# Patient Record
Sex: Male | Born: 1970
Health system: Southern US, Community
[De-identification: ages and names within clinical notes are randomized; demographics above are authoritative.]

## PROBLEM LIST (undated history)

## (undated) DIAGNOSIS — R748 Abnormal levels of other serum enzymes: Secondary | ICD-10-CM

## (undated) DIAGNOSIS — G8929 Other chronic pain: Secondary | ICD-10-CM

## (undated) DIAGNOSIS — J309 Allergic rhinitis, unspecified: Secondary | ICD-10-CM

## (undated) DIAGNOSIS — E559 Vitamin D deficiency, unspecified: Secondary | ICD-10-CM

## (undated) DIAGNOSIS — U071 COVID-19: Secondary | ICD-10-CM

## (undated) HISTORY — DX: COVID-19: U07.1

## (undated) HISTORY — DX: Abnormal levels of other serum enzymes: R74.8

## (undated) HISTORY — PX: WISDOM TOOTH EXTRACTION: SHX21

## (undated) HISTORY — DX: Other chronic pain: G89.29

## (undated) HISTORY — DX: Allergic rhinitis, unspecified: J30.9

## (undated) HISTORY — DX: Vitamin D deficiency, unspecified: E55.9

---
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
## 2018-05-05 ENCOUNTER — Other Ambulatory Visit: Payer: Self-pay

## 2018-05-05 ENCOUNTER — Ambulatory Visit
Admission: EM | Admit: 2018-05-05 | Discharge: 2018-05-05 | Disposition: A | Discharge status: Home / Self Care | Payer: BLUE CROSS/BLUE SHIELD | Attending: Family Medicine | Admitting: Family Medicine

## 2018-05-05 DIAGNOSIS — K921 Melena: Secondary | ICD-10-CM | POA: Insufficient documentation

## 2018-05-05 DIAGNOSIS — R1013 Epigastric pain: Secondary | ICD-10-CM | POA: Diagnosis not present

## 2018-05-05 DIAGNOSIS — R110 Nausea: Secondary | ICD-10-CM | POA: Diagnosis not present

## 2018-05-05 LAB — CBC WITH DIFFERENTIAL/PLATELET
ABS IMMATURE GRANULOCYTES: 0.02 10*3/uL (ref 0.00–0.07)
Basophils Absolute: 0 10*3/uL (ref 0.0–0.1)
Basophils Relative: 0 %
Eosinophils Absolute: 0 10*3/uL (ref 0.0–0.5)
Eosinophils Relative: 0 %
HCT: 41.7 % (ref 39.0–52.0)
Hemoglobin: 14.2 g/dL (ref 13.0–17.0)
Immature Granulocytes: 0 %
Lymphocytes Relative: 34 %
Lymphs Abs: 2.8 10*3/uL (ref 0.7–4.0)
MCH: 30.7 pg (ref 26.0–34.0)
MCHC: 34.1 g/dL (ref 30.0–36.0)
MCV: 90.3 fL (ref 80.0–100.0)
MONO ABS: 0.9 10*3/uL (ref 0.1–1.0)
Monocytes Relative: 11 %
Neutro Abs: 4.6 10*3/uL (ref 1.7–7.7)
Neutrophils Relative %: 55 %
Platelets: 280 10*3/uL (ref 150–400)
RBC: 4.62 MIL/uL (ref 4.22–5.81)
RDW: 12.4 % (ref 11.5–15.5)
WBC: 8.3 10*3/uL (ref 4.0–10.5)
nRBC: 0 % (ref 0.0–0.2)

## 2018-05-05 LAB — COMPREHENSIVE METABOLIC PANEL
ALT: 127 U/L — AB (ref 0–44)
AST: 74 U/L — ABNORMAL HIGH (ref 15–41)
Albumin: 3.7 g/dL (ref 3.5–5.0)
Alkaline Phosphatase: 50 U/L (ref 38–126)
Anion gap: 9 (ref 5–15)
BUN: 41 mg/dL — ABNORMAL HIGH (ref 6–20)
CO2: 25 mmol/L (ref 22–32)
Calcium: 8.4 mg/dL — ABNORMAL LOW (ref 8.9–10.3)
Chloride: 103 mmol/L (ref 98–111)
Creatinine, Ser: 0.84 mg/dL (ref 0.61–1.24)
GFR calc Af Amer: >60 mL/min (ref >60)
GFR calc non Af Amer: >60 mL/min (ref >60)
Glucose, Bld: 129 mg/dL — ABNORMAL HIGH (ref 70–99)
Potassium: 4.2 mmol/L (ref 3.5–5.1)
Sodium: 137 mmol/L (ref 135–145)
Total Bilirubin: 0.7 mg/dL (ref 0.3–1.2)
Total Protein: 6.9 g/dL (ref 6.5–8.1)

## 2018-05-05 MED ORDER — ONDANSETRON 8 MG PO TBDP: 8.0000 mg | ORAL_TABLET | Freq: Three times a day (TID) | ORAL | 0 refills | Status: DC | PRN

## 2018-05-05 MED ORDER — ONDANSETRON HCL 4 MG/2ML IJ SOLN: 8.0000 mg | Freq: Once | INTRAMUSCULAR | Status: DC

## 2018-05-05 MED ORDER — ONDANSETRON 8 MG PO TBDP
8.0000 mg | ORAL_TABLET | Freq: Once | ORAL | Status: AC
  Administered 2018-05-05: 8 mg via ORAL

## 2018-05-05 NOTE — ED Provider Notes (Signed)
[facility]    CSN: [id number] Arrival date & time: 05/05/18  1746     History   Chief Complaint Chief Complaint  Patient presents with  . Emesis    HPI Cory Blake is a 48 y.o. male.   48 yo male with a c/o nausea, lack of appetite, mild loose stools and since last night black tarry stools x 2. Denies any fevers, chills, abdominal pain, bright red blood per rectum, dizziness, chest pain, shortness of breath, hematemesis. States he had cold symptoms last week. Denies alcohol use.   The history is provided by the patient.    History reviewed. No pertinent past medical history.  There are no active problems to display for this patient.   Past Surgical History:  Procedure Laterality Date  . GASTRIC FUNDOPLICATION  4580  . WISDOM TOOTH EXTRACTION         Home Medications    Prior to Admission medications   Medication Sig Start Date End Date Taking? Authorizing Provider  ondansetron (ZOFRAN ODT) 8 MG disintegrating tablet Take 1 tablet (8 mg total) by mouth every 8 (eight) hours as needed. 05/05/18   Norval Gable, MD    Family History Family History  Problem Relation Age of Onset  . Kidney failure Father   . Heart disease Father     Social History Social History   Tobacco Use  . Smoking status: Never Smoker  . Smokeless tobacco: Never Used  Substance Use Topics  . Alcohol use: Yes    Comment: minimal  . Drug use: Not Currently     Allergies   Patient has no known allergies.   Review of Systems Review of Systems   Physical Exam Triage Vital Signs ED Triage Vitals  Enc Vitals Group     BP 05/05/18 1838 135/85     Pulse Rate 05/05/18 1838 (!) 113     Resp 05/05/18 1838 18     Temp 05/05/18 1838 97.8 F (36.6 C)     Temp Source 05/05/18 1838 Oral     SpO2 05/05/18 1838 96 %     Weight 05/05/18 1836 201 lb (91.2 kg)     Height 05/05/18 1836 6' (1.829 m)     Head Circumference --      Peak Flow --      Pain Score 05/05/18 1835 0       Pain Loc --      Pain Edu? --      Excl. in [place]? --    No data found.  Updated Vital Signs BP 102/78 (BP Location: Left Arm)   Pulse 99   Temp 97.8 F (36.6 C) (Oral)   Resp 18   Ht 6' (1.829 m)   Wt 91.2 kg   SpO2 98%   BMI 27.26 kg/m   Visual Acuity Right Eye Distance:   Left Eye Distance:   Bilateral Distance:    Right Eye Near:   Left Eye Near:    Bilateral Near:     Physical Exam Vitals signs and nursing note reviewed.  Constitutional:      General: He is not in acute distress.    Appearance: He is well-developed. He is not diaphoretic.  HENT:     Head: Normocephalic and atraumatic.  Cardiovascular:     Rate and Rhythm: Normal rate and regular rhythm.     Heart sounds: Normal heart sounds. No murmur.  Pulmonary:     Effort: Pulmonary effort is normal. No respiratory  distress.     Breath sounds: Normal breath sounds. No wheezing or rales.  Abdominal:     General: Bowel sounds are normal. There is no distension.     Palpations: Abdomen is soft. There is no mass.     Tenderness: There is no abdominal tenderness. There is no right CVA tenderness, left CVA tenderness, guarding or rebound.     Hernia: No hernia is present.  Skin:    Findings: No rash.  Neurological:     Mental Status: He is alert and oriented to person, place, and time.      UC Treatments / Results  Labs (all labs ordered are listed, but only abnormal results are displayed) Labs Reviewed  COMPREHENSIVE METABOLIC PANEL - Abnormal; Notable for the following components:      Result Value   Glucose, Bld 129 (*)    BUN 41 (*)    Calcium 8.4 (*)    AST 74 (*)    ALT 127 (*)    All other components within normal limits  CBC WITH DIFFERENTIAL/PLATELET    EKG None  Radiology No results found.  Procedures Procedures (including critical care time)  Medications Ordered in UC Medications  ondansetron (ZOFRAN-ODT) disintegrating tablet 8 mg (8 mg Oral Given 05/05/18 2034)     Initial Impression / Assessment and Plan / UC Course  I have reviewed the triage vital signs and the nursing notes.  Pertinent labs & imaging results that were available during my care of the patient were reviewed by me and considered in my medical decision making (see chart for details).      Final Clinical Impressions(s) / UC Diagnoses   Final diagnoses:  Melena  Nausea without vomiting  Dyspepsia     Discharge Instructions     Prilosec or Nexium over the counter once a day Follow up with gastroenterologist    ED Prescriptions    Medication Sig Dispense Auth. Provider   ondansetron (ZOFRAN ODT) 8 MG disintegrating tablet Take 1 tablet (8 mg total) by mouth every 8 (eight) hours as needed. 6 tablet Norval Gable, MD      1. Lab results and diagnosis reviewed with patient 2. rx as per orders above; reviewed possible side effects, interactions, risks and benefits  3. Recommend supportive treatment as above 4. Follow up with GI  Controlled Substance Prescriptions [place] Controlled Substance Registry consulted? Not Applicable   Norval Gable, MD 05/05/18 2222

## 2018-05-05 NOTE — Discharge Instructions (Addendum)
Prilosec or Nexium over the counter once a day Follow up with gastroenterologist

## 2018-05-05 NOTE — ED Triage Notes (Signed)
Patient complains of nausea, lack of appetite and diarrhea. Patient states that he has noticed a black tarry" frothy" stool. Patient states that he has been unable to eat. States that he has cold like symptoms last week.

## 2018-05-13 ENCOUNTER — Ambulatory Visit (INDEPENDENT_AMBULATORY_CARE_PROVIDER_SITE_OTHER): Payer: BLUE CROSS/BLUE SHIELD | Admitting: Gastroenterology

## 2018-05-13 ENCOUNTER — Encounter: Payer: Self-pay | Admitting: Gastroenterology

## 2018-05-13 DIAGNOSIS — K921 Melena: Secondary | ICD-10-CM

## 2018-05-13 DIAGNOSIS — R1013 Epigastric pain: Secondary | ICD-10-CM | POA: Diagnosis not present

## 2018-05-13 DIAGNOSIS — R748 Abnormal levels of other serum enzymes: Secondary | ICD-10-CM | POA: Diagnosis not present

## 2018-05-13 DIAGNOSIS — R110 Nausea: Secondary | ICD-10-CM

## 2018-05-13 NOTE — Progress Notes (Signed)
Cory Blake  [place], [place] [address]  Main: [phone]  Fax: [phone]   Gastroenterology Consultation  Referring Provider:     Dr. Norval Gable Primary Care Physician:  Patient, No Pcp Per Reason for Consultation:     Melena        HPI:    Chief Complaint  Patient presents with  . Establish Care    Melena, nausea w/o vomiting, dyspepsia    Cory Blake is a 48 y.o. y/o male referred for consultation & management  by Dr. Patient, No Pcp Per.  Patient went to the ER on May 05, 2018 after 1 to 2-day history of black stool.  Patient reports using Advil for 2 to 3 days prior to that episode.  No nausea vomiting, hematochezia, or hematemesis.  No abdominal pain.  No Pepto-Bismol or oral iron use prior to this episode.  No prior history of melena.  In the ER CBC was normal and patient was discharged with outpatient follow-up.  Prior to this episode he describes having loose stools at home as well.  AST ALT were mildly elevated during the ER visit.  Patient was discharged with Prilosec per ER provider.  Patient also describes 1-2 episodes of dysphagia couple but never food impaction.  No prior upper endoscopy or colonoscopy.  No family history of colon cancer.  Past medical history: None  Past Surgical History:  Procedure Laterality Date  . GASTRIC FUNDOPLICATION  4742  . WISDOM TOOTH EXTRACTION      Prior to Admission medications   Medication Sig Start Date End Date Taking? Authorizing Provider  omeprazole (PRILOSEC) 20 MG capsule Take 20 mg by mouth daily.   Yes [provider]  ondansetron (ZOFRAN ODT) 8 MG disintegrating tablet Take 1 tablet (8 mg total) by mouth every 8 (eight) hours as needed. Patient not taking: Reported on 05/13/2018 05/05/18   Norval Gable, MD    Family History  Problem Relation Age of Onset  . Kidney failure Father   . Heart disease Father      Social History   Tobacco Use  . Smoking status:  Never Smoker  . Smokeless tobacco: Never Used  Substance Use Topics  . Alcohol use: Yes    Comment: minimal  . Drug use: Not Currently    Allergies as of 05/13/2018  . (No Known Allergies)    Review of Systems:    All systems reviewed and negative except where noted in HPI.   Physical Exam:   Psych:  Alert and cooperative. Normal mood and affect. General:   Alert,  Well-developed, well-nourished, pleasant and cooperative in NAD Head:  Normocephalic and atraumatic. Eyes:  Sclera clear, no icterus.   Conjunctiva pink. Ears:  Normal auditory acuity. Nose:  No deformity, discharge, or lesions. Mouth:  No deformity or lesions,oropharynx pink & moist. Neck:  Supple; no masses or thyromegaly. Abdomen:  Normal bowel sounds.  No bruits.  Soft, non-tender and non-distended without masses, hepatosplenomegaly or hernias noted.  No guarding or rebound tenderness.    Msk:  Symmetrical without gross deformities. Good, equal movement & strength bilaterally. Pulses:  Normal pulses noted. Extremities:  No clubbing or edema.  No cyanosis. Neurologic:  Alert and oriented x3;  grossly normal neurologically. Skin:  Intact without significant lesions or rashes. No jaundice. Lymph Nodes:  No significant cervical adenopathy. Psych:  Alert and cooperative. Normal mood and affect.   Labs: CBC    Component Value Date/Time  WBC 8.3 05/05/2018 2023   RBC 4.62 05/05/2018 2023   HGB 14.2 05/05/2018 2023   HCT 41.7 05/05/2018 2023   PLT 280 05/05/2018 2023   MCV 90.3 05/05/2018 2023   MCH 30.7 05/05/2018 2023   MCHC 34.1 05/05/2018 2023   RDW 12.4 05/05/2018 2023   LYMPHSABS 2.8 05/05/2018 2023   MONOABS 0.9 05/05/2018 2023   EOSABS 0.0 05/05/2018 2023   BASOSABS 0.0 05/05/2018 2023   CMP     Component Value Date/Time   NA 137 05/05/2018 2023   K 4.2 05/05/2018 2023   CL 103 05/05/2018 2023   CO2 25 05/05/2018 2023   GLUCOSE 129 (H) 05/05/2018 2023   BUN 41 (H) 05/05/2018 2023    CREATININE 0.84 05/05/2018 2023   CALCIUM 8.4 (L) 05/05/2018 2023   PROT 6.9 05/05/2018 2023   ALBUMIN 3.7 05/05/2018 2023   AST 74 (H) 05/05/2018 2023   ALT 127 (H) 05/05/2018 2023   ALKPHOS 50 05/05/2018 2023   BILITOT 0.7 05/05/2018 2023   GFRNONAA >60 05/05/2018 2023   GFRAA >60 05/05/2018 2023    Imaging Studies: No results found.  Assessment and Plan:   Cory Blake is a 48 y.o. y/o male has been referred for episode of melanotic stool that led to an ER visit in January 2020, with subsequent resolution of symptoms, normal hemoglobin during the ER visit  Since patient's episode of melena occurred after 3-day history of Aleve use, peptic ulcer disease is certainly a possibility However, his hemoglobin was stable and now his bowel movements consist of formed brown stool with no blood, or diarrhea.  Therefore, no signs of active GI bleeding at this time  Patient is interested in upper endoscopy to rule out any underlying lesions, which is reasonable  Patient advised to avoid NSAIDs  We also discussed that he is due for screening colonoscopy current American Cancer Society guidelines.  Patient is interested in doing both EGD and colonoscopy together, however he is going away for vacation for at least 3 weeks next week and since elective colonoscopy cannot be scheduled before then due to schedule availability, he is interested in going ahead with EGD this week, and screening colonoscopy in the future.  Therefore we will schedule for EGD at this time  We will also repeat liver enzymes and initiate hepatitis work-up along with right upper quadrant ultrasound for elevated liver enzymes which were likely transient  Patient is in process of setting a primary care provider and the importance of doing this was discussed as well and he verbalized understanding.  I have discussed alternative options, risks & benefits,  which include, but are not limited to, bleeding, infection,  perforation,respiratory complication & drug reaction.  The patient agrees with this plan & written consent will be obtained.       Dr Cory Antigua  Speech recognition software was used to dictate the above note.

## 2018-05-14 LAB — CERULOPLASMIN: Ceruloplasmin: 28 mg/dL (ref 16.0–31.0)

## 2018-05-14 LAB — HEPATIC FUNCTION PANEL
ALT: 61 IU/L — ABNORMAL HIGH (ref 0–44)
AST: 27 IU/L (ref 0–40)
Albumin: 4.1 g/dL (ref 4.0–5.0)
Alkaline Phosphatase: 65 IU/L (ref 39–117)
Bilirubin Total: 0.5 mg/dL (ref 0.0–1.2)
Bilirubin, Direct: 0.15 mg/dL (ref 0.00–0.40)
Total Protein: 6.3 g/dL (ref 6.0–8.5)

## 2018-05-14 LAB — HCV COMMENT:

## 2018-05-14 LAB — HEPATITIS A ANTIBODY, TOTAL: Hep A Total Ab: NEGATIVE

## 2018-05-14 LAB — HEPATITIS B CORE ANTIBODY, TOTAL: Hep B Core Total Ab: NEGATIVE

## 2018-05-14 LAB — HEPATITIS B SURFACE ANTIBODY,QUALITATIVE: Hep B Surface Ab, Qual: REACTIVE

## 2018-05-14 LAB — FERRITIN: Ferritin: 86 ng/mL (ref 30–400)

## 2018-05-14 LAB — HEPATITIS A ANTIBODY, IGM: Hep A IgM: NEGATIVE

## 2018-05-14 LAB — MITOCHONDRIAL/SMOOTH MUSCLE AB PNL
Mitochondrial Ab: <20 Units (ref 0.0–20.0)
Smooth Muscle Ab: 13 Units (ref 0–19)

## 2018-05-14 LAB — HEPATITIS B SURFACE ANTIGEN: Hepatitis B Surface Ag: NEGATIVE

## 2018-05-14 LAB — HEPATITIS C ANTIBODY (REFLEX)

## 2018-05-15 ENCOUNTER — Ambulatory Visit: Payer: BLUE CROSS/BLUE SHIELD | Admitting: Certified Registered"

## 2018-05-15 ENCOUNTER — Other Ambulatory Visit: Payer: Self-pay

## 2018-05-15 ENCOUNTER — Ambulatory Visit
Admission: RE | Admit: 2018-05-15 | Discharge: 2018-05-15 | Disposition: A | Discharge status: Home / Self Care | Payer: BLUE CROSS/BLUE SHIELD | Attending: Gastroenterology | Admitting: Gastroenterology

## 2018-05-15 ENCOUNTER — Encounter
Admission: RE | Disposition: A | Discharge status: Home / Self Care | Payer: Self-pay | Source: Home / Self Care | Attending: Gastroenterology

## 2018-05-15 ENCOUNTER — Telehealth: Payer: Self-pay

## 2018-05-15 ENCOUNTER — Encounter: Payer: Self-pay | Admitting: *Deleted

## 2018-05-15 DIAGNOSIS — Z8249 Family history of ischemic heart disease and other diseases of the circulatory system: Secondary | ICD-10-CM | POA: Insufficient documentation

## 2018-05-15 DIAGNOSIS — Z9889 Other specified postprocedural states: Secondary | ICD-10-CM

## 2018-05-15 DIAGNOSIS — Z841 Family history of disorders of kidney and ureter: Secondary | ICD-10-CM | POA: Insufficient documentation

## 2018-05-15 DIAGNOSIS — R945 Abnormal results of liver function studies: Secondary | ICD-10-CM | POA: Diagnosis not present

## 2018-05-15 DIAGNOSIS — Z79899 Other long term (current) drug therapy: Secondary | ICD-10-CM | POA: Insufficient documentation

## 2018-05-15 DIAGNOSIS — K921 Melena: Secondary | ICD-10-CM

## 2018-05-15 DIAGNOSIS — R748 Abnormal levels of other serum enzymes: Secondary | ICD-10-CM

## 2018-05-15 HISTORY — PX: ESOPHAGOGASTRODUODENOSCOPY (EGD) WITH PROPOFOL: SHX5813

## 2018-05-15 SURGERY — ESOPHAGOGASTRODUODENOSCOPY (EGD) WITH PROPOFOL
Anesthesia: General

## 2018-05-15 MED ORDER — ALBUTEROL SULFATE HFA 108 (90 BASE) MCG/ACT IN AERS
INHALATION_SPRAY | RESPIRATORY_TRACT | Status: DC | PRN
  Administered 2018-05-15: 2 via RESPIRATORY_TRACT

## 2018-05-15 MED ORDER — PROPOFOL 500 MG/50ML IV EMUL
INTRAVENOUS | Status: DC | PRN
  Administered 2018-05-15: 200 ug/kg/min via INTRAVENOUS

## 2018-05-15 MED ORDER — SODIUM CHLORIDE 0.9 % IV SOLN
INTRAVENOUS | Status: DC
  Administered 2018-05-15: 1000 mL via INTRAVENOUS

## 2018-05-15 MED ORDER — PROPOFOL 10 MG/ML IV BOLUS
INTRAVENOUS | Status: AC
  Filled 2018-05-15: qty 20

## 2018-05-15 MED ORDER — PROPOFOL 10 MG/ML IV BOLUS
INTRAVENOUS | Status: AC
  Filled 2018-05-15: qty 40

## 2018-05-15 MED ORDER — PROPOFOL 10 MG/ML IV BOLUS
INTRAVENOUS | Status: DC | PRN
  Administered 2018-05-15: 40 mg via INTRAVENOUS
  Administered 2018-05-15: 60 mg via INTRAVENOUS

## 2018-05-15 NOTE — Anesthesia Preprocedure Evaluation (Signed)
Anesthesia Evaluation  Patient identified by MRN, date of birth, ID band Patient awake    Reviewed: Allergy & Precautions, H&P , NPO status , Patient's Chart, lab work & pertinent test results, reviewed documented beta blocker date and time   Airway Mallampati: II   Neck ROM: full    Dental  (+) Poor Dentition   Pulmonary neg shortness of breath, asthma ,    Pulmonary exam normal        Cardiovascular Exercise Tolerance: Good negative cardio ROS Normal cardiovascular exam Rhythm:regular Rate:Normal     Neuro/Psych negative neurological ROS  negative psych ROS   GI/Hepatic negative GI ROS, Neg liver ROS,   Endo/Other  negative endocrine ROS  Renal/GU negative Renal ROS  negative genitourinary   Musculoskeletal   Abdominal   Peds  Hematology negative hematology ROS (+)   Anesthesia Other Findings History reviewed. No pertinent past medical history. Past Surgical History: 9480: GASTRIC FUNDOPLICATION No date: WISDOM TOOTH EXTRACTION   Reproductive/Obstetrics negative OB ROS                             Anesthesia Physical Anesthesia Plan  ASA: II  Anesthesia Plan: General   Post-op Pain Management:    Induction:   PONV Risk Score and Plan:   Airway Management Planned:   Additional Equipment:   Intra-op Plan:   Post-operative Plan:   Informed Consent: I have reviewed the patients History and Physical, chart, labs and discussed the procedure including the risks, benefits and alternatives for the proposed anesthesia with the patient or authorized representative who has indicated his/her understanding and acceptance.     Dental Advisory Given  Plan Discussed with: CRNA  Anesthesia Plan Comments:         Anesthesia Quick Evaluation

## 2018-05-15 NOTE — H&P (Signed)
Vonda Antigua, MD [address], [place], [place], [place], [address] [address], [place], [place], [place], [address] Phone: [phone]  Fax: [phone]  Primary Care Physician:  Patient, No Pcp Per   Pre-Procedure History & Physical: HPI:  Cory Blake is a 48 y.o. male is here for an EGD.   History reviewed. No pertinent past medical history.  Past Surgical History:  Procedure Laterality Date  . GASTRIC FUNDOPLICATION  7209  . WISDOM TOOTH EXTRACTION      Prior to Admission medications   Medication Sig Start Date End Date Taking? Authorizing Provider  omeprazole (PRILOSEC) 20 MG capsule Take 20 mg by mouth daily.   Yes [provider]  ondansetron (ZOFRAN ODT) 8 MG disintegrating tablet Take 1 tablet (8 mg total) by mouth every 8 (eight) hours as needed. Patient not taking: Reported on 05/13/2018 05/05/18   Norval Gable, MD    Allergies as of 05/13/2018  . (No Known Allergies)    Family History  Problem Relation Age of Onset  . Kidney failure Father   . Heart disease Father     Social History   Socioeconomic History  . Marital status: Married    Spouse name: Not on file  . Number of children: Not on file  . Years of education: Not on file  . Highest education level: Not on file  Occupational History  . Not on file  Social Needs  . Financial resource strain: Not on file  . Food insecurity:    Worry: Not on file    Inability: Not on file  . Transportation needs:    Medical: Not on file    Non-medical: Not on file  Tobacco Use  . Smoking status: Never Smoker  . Smokeless tobacco: Never Used  Substance and Sexual Activity  . Alcohol use: Yes    Comment: minimal  . Drug use: Never  . Sexual activity: Not on file  Lifestyle  . Physical activity:    Days per week: Not on file    Minutes per session: Not on file  . Stress: Not on file  Relationships  . Social connections:    Talks on phone: Not on file    Gets together: Not on file      Attends religious service: Not on file    Active member of club or organization: Not on file    Attends meetings of clubs or organizations: Not on file    Relationship status: Not on file  . Intimate partner violence:    Fear of current or ex partner: Not on file    Emotionally abused: Not on file    Physically abused: Not on file    Forced sexual activity: Not on file  Other Topics Concern  . Not on file  Social History Narrative  . Not on file    Review of Systems: See HPI, otherwise negative ROS  Physical Exam: BP (!) 121/94   Pulse 81   Temp (!) 97.3 F (36.3 C) (Tympanic)   Resp 18   Ht 5' 11.5" (1.816 m)   Wt 87.5 kg   SpO2 100%   BMI 26.54 kg/m  General:   Alert,  pleasant and cooperative in NAD Head:  Normocephalic and atraumatic. Neck:  Supple; no masses or thyromegaly. Lungs:  Clear throughout to auscultation, normal respiratory effort.    Heart:  +S1, +S2, Regular rate and rhythm, No edema. Abdomen:  Soft, nontender and nondistended. Normal bowel sounds, without guarding, and  without rebound.   Neurologic:  Alert and  oriented x4;  grossly normal neurologically.  Impression/Plan: Cory Blake is here for an EGD for melena.  Risks, benefits, limitations, and alternatives regarding the procedure have been reviewed with the patient.  Questions have been answered.  All parties agreeable.   [name], MD  05/15/2018, 9:42 AM

## 2018-05-15 NOTE — Anesthesia Post-op Follow-up Note (Signed)
Anesthesia QCDR form completed.        

## 2018-05-15 NOTE — Telephone Encounter (Signed)
RUQ ultrasound scheduled for 06/10/2018 at the [address] [facility] imaging (pt given address) at 8 am, arrival time 7:45 am. Nothing to eat or drink after midnight the day before ultrasound. Pt voiced understanding.

## 2018-05-15 NOTE — Transfer of Care (Signed)
Immediate Anesthesia Transfer of Care Note  Patient: Cory Blake  Procedure(s) Performed: ESOPHAGOGASTRODUODENOSCOPY (EGD) WITH PROPOFOL (N/A )  Patient Location: PACU  Anesthesia Type:General  Level of Consciousness: drowsy  Airway & Oxygen Therapy: Patient Spontanous Breathing and Patient connected to face mask oxygen  Post-op Assessment: Report given to RN  Post vital signs: Reviewed and stable  Last Vitals:  Vitals Value Taken Time  BP 96/67 05/15/2018 10:02 AM  Temp 36.1 C 05/15/2018 10:01 AM  Pulse 92 05/15/2018 10:02 AM  Resp 12 05/15/2018 10:02 AM  SpO2 100 % 05/15/2018 10:02 AM  Vitals shown include unvalidated device data.  Last Pain:  Vitals:   05/15/18 1001  TempSrc: Tympanic  PainSc: Asleep         Complications: No apparent anesthesia complications

## 2018-05-15 NOTE — Op Note (Signed)
[facility] Patient Name: Cory Blake Procedure Date: 05/15/2018 9:24 AM MRN: [id number] Account #: [id number] Date of Birth: 09/24/1970 Admit Type: Outpatient Age: 48 Room: [facility] ENDO ROOM 2 Gender: Male Note Status: Finalized Procedure:            Upper GI endoscopy Indications:          Melena Providers:            Daryus Sowash B. Bonna Gains MD, MD Referring MD:         Forest Gleason Md, MD (Referring MD) Medicines:            Monitored Anesthesia Care Complications:        No immediate complications. Procedure:            Pre-Anesthesia Assessment:                       - Prior to the procedure, a History and Physical was                        performed, and patient medications, allergies and                        sensitivities were reviewed. The patient's tolerance of                        previous anesthesia was reviewed.                       - The risks and benefits of the procedure and the                        sedation options and risks were discussed with the                        patient. All questions were answered and informed                        consent was obtained.                       - Patient identification and proposed procedure were                        verified prior to the procedure by the physician, the                        nurse, the anesthesiologist, the anesthetist and the                        technician. The procedure was verified in the procedure                        room.                       - ASA Grade Assessment: II - A patient with mild                        systemic disease.                       After  obtaining informed consent, the endoscope was                        passed under direct vision. Throughout the procedure,                        the patient's blood pressure, pulse, and oxygen                        saturations were monitored continuously. The Endoscope                        was introduced  through the mouth, and advanced to the                        second part of duodenum. The upper GI endoscopy was                        accomplished with ease. The patient tolerated the                        procedure well. Findings:      The examined esophagus was normal.      Evidence of a Nissen fundoplication was found in the gastric fundus. The       wrap appeared intact. This was traversed.      The exam of the stomach was otherwise normal.      The duodenal bulb, second portion of the duodenum and examined duodenum       were normal. Impression:           - Normal esophagus.                       - A Nissen fundoplication was found. The wrap appears                        intact.                       - Normal duodenal bulb, second portion of the duodenum                        and examined duodenum.                       - No specimens collected. Recommendation:       - Perform a colonoscopy, for screening. Please call pt                        to schedule.                       - Discharge patient to home (with escort).                       - Advance diet as tolerated.                       - Continue present medications.                       - Patient has a contact number available for  emergencies. The signs and symptoms of potential                        delayed complications were discussed with the patient.                        Return to normal activities tomorrow. Written discharge                        instructions were provided to the patient.                       - Discharge patient to home (with escort).                       - The findings and recommendations were discussed with                        the patient.                       - The findings and recommendations were discussed with                        the patient's family. Procedure Code(s):    --- Professional ---                       [phone], Esophagogastroduodenoscopy,  flexible, transoral;                        diagnostic, including collection of specimen(s) by                        brushing or washing, when performed (separate procedure) Diagnosis Code(s):    --- Professional ---                       M22.633, Other specified postprocedural states                       K92.1, Melena (includes Hematochezia) CPT copyright 2018 American Medical Association. All rights reserved. The codes documented in this report are preliminary and upon coder review may  be revised to meet current compliance requirements.  Vonda Antigua, MD Margretta Sidle B. Bonna Gains MD, MD 05/15/2018 10:05:29 AM This report has been signed electronically. Number of Addenda: 0 Note Initiated On: 05/15/2018 9:24 AM Estimated Blood Loss: Estimated blood loss: none.      [facility]

## 2018-05-16 ENCOUNTER — Encounter: Payer: Self-pay | Admitting: Gastroenterology

## 2018-05-16 NOTE — Anesthesia Postprocedure Evaluation (Signed)
Anesthesia Post Note  Patient: Cory Blake  Procedure(s) Performed: ESOPHAGOGASTRODUODENOSCOPY (EGD) WITH PROPOFOL (N/A )  Patient location during evaluation: PACU Anesthesia Type: General Level of consciousness: awake and alert Pain management: pain level controlled Vital Signs Assessment: post-procedure vital signs reviewed and stable Respiratory status: spontaneous breathing, nonlabored ventilation, respiratory function stable and patient connected to nasal cannula oxygen Cardiovascular status: blood pressure returned to baseline and stable Postop Assessment: no apparent nausea or vomiting Anesthetic complications: no     Last Vitals:  Vitals:   05/15/18 1001 05/15/18 1031  BP: 96/67 103/71  Pulse:    Resp:    Temp: (!) 36.1 C   SpO2:      Last Pain:  Vitals:   05/16/18 0748  TempSrc:   PainSc: 0-No pain                 Molli Barrows

## 2018-06-10 ENCOUNTER — Other Ambulatory Visit: Payer: Self-pay

## 2018-06-10 ENCOUNTER — Ambulatory Visit
Admission: RE | Admit: 2018-06-10 | Discharge: 2018-06-10 | Disposition: A | Discharge status: Home / Self Care | Payer: BLUE CROSS/BLUE SHIELD | Source: Ambulatory Visit | Attending: Gastroenterology | Admitting: Gastroenterology

## 2018-06-10 DIAGNOSIS — R748 Abnormal levels of other serum enzymes: Secondary | ICD-10-CM | POA: Insufficient documentation

## 2018-06-10 DIAGNOSIS — R7989 Other specified abnormal findings of blood chemistry: Secondary | ICD-10-CM | POA: Diagnosis not present

## 2018-06-17 ENCOUNTER — Telehealth: Payer: Self-pay

## 2018-06-17 NOTE — Telephone Encounter (Signed)
-----   Message from [name], MD sent at 06/11/2018 10:41 AM EST ----- Cory Blake please let patient know, his ultrasound was normal. His liver is reported to be normal. We will repeat his liver enzymes on his visit to evaluate if they have improved. He should set up a PCP before his next visit. He should avoid OTC supplements, excessive alcohol use.

## 2018-06-17 NOTE — Telephone Encounter (Signed)
Attempted to contact patient however his voicemail has not been set up.  I've emailed him to contact the office to discuss results.  Thanks [employer]

## 2018-06-18 ENCOUNTER — Telehealth: Payer: Self-pay

## 2018-06-18 NOTE — Telephone Encounter (Signed)
Pt notified of ultrasound of liver results and of need of lab work on next visit (5/14). Also informed of need to have PCP by his next visit and to avoid OTC supplements, excessive alcohol use.

## 2018-06-18 NOTE — Telephone Encounter (Signed)
-----   Message from [name], MD sent at 06/11/2018 10:41 AM EST ----- Jackelyn Poling please let patient know, his ultrasound was normal. His liver is reported to be normal. We will repeat his liver enzymes on his visit to evaluate if they have improved. He should set up a PCP before his next visit. He should avoid OTC supplements, excessive alcohol use.

## 2018-06-27 ENCOUNTER — Other Ambulatory Visit: Payer: Self-pay

## 2018-07-10 ENCOUNTER — Telehealth: Payer: Self-pay

## 2018-07-10 NOTE — Telephone Encounter (Signed)
Copied from [place] [id number]. Topic: Appointment Scheduling - New Patient >> Jul 10, 2018  3:27 PM Leward Quan A wrote: New patient has been scheduled for your office. Provider: Karlyn Agee -Scocuzza Date of Appointment: 09/17/2018  Route to department's PEC pool.

## 2018-08-11 ENCOUNTER — Other Ambulatory Visit: Payer: Self-pay

## 2018-08-11 ENCOUNTER — Ambulatory Visit (INDEPENDENT_AMBULATORY_CARE_PROVIDER_SITE_OTHER): Payer: BLUE CROSS/BLUE SHIELD | Admitting: Gastroenterology

## 2018-08-11 ENCOUNTER — Ambulatory Visit: Payer: BLUE CROSS/BLUE SHIELD | Admitting: Gastroenterology

## 2018-08-11 ENCOUNTER — Encounter: Payer: Self-pay | Admitting: Gastroenterology

## 2018-08-11 DIAGNOSIS — R748 Abnormal levels of other serum enzymes: Secondary | ICD-10-CM

## 2018-08-11 NOTE — Addendum Note (Signed)
Addended by: Earl Lagos on: 08/11/2018 11:41 AM   Modules accepted: Orders

## 2018-08-11 NOTE — Progress Notes (Addendum)
Cory Antigua, MD [address]  [place]  [place], [place] [address]  Main: [phone]  Fax: [phone]   Primary Care Physician: Patient, No Pcp Per  Virtual Visit via Video Note  I connected with patient on 08/11/18 at  9:15 AM EDT by video (using [email or web address]) and verified that I am speaking with the correct person using two identifiers.   I discussed the limitations, risks, security and privacy concerns of performing an evaluation and management service by video and the availability of in person appointments. I also discussed with the patient that there may be a patient responsible charge related to this service. The patient expressed understanding and agreed to proceed.  Location of Patient: Home Location of Provider: Home Persons involved: Patient and provider only (Nursing staff checked in patient via phone but were not physically involved in the video interaction - see their notes)   History of Present Illness: Chief Complaint  Patient presents with  . Follow-up    elevated liver enzymes    HPI: Cory Blake is a 48 y.o. male previously seen in February 2020.  At that time patient had reported 3-day history of melena that occurred in January 2020 and then self resolved.  Patient did not have any other symptoms besides that.  He underwent EGD in February 2020 which did not reveal any ulcers or source of melena.  Symptoms have since resolved patient does not have any black stool.  The patient denies abdominal or flank pain, anorexia, nausea or vomiting, dysphagia, change in bowel habits or black or bloody stools or weight loss.  Patient has never had a screening colonoscopy.  His colonoscopy was discussed during his prior appointment to rule out any colonic lesions that could have also led to the melena.  At that time patient was going out of town and wanted to get the EGD done quickly and therefore refused scheduling colonoscopy with the EGD at that time.  He was  also noted to have elevated liver enzymes in January 2020, and therefore had ordered blood work for elevated liver enzymes.  This showed improvement in liver enzymes with only mildly elevated ALT to his 60s in February 2020.  The rest of his liver work-up including viral and autoimmune hepatitis work-up and liver ultrasound was normal.  Patient denies any heavy alcohol intake or hepatotoxic drugs.  Current Outpatient Medications  Medication Sig Dispense Refill  . omeprazole (PRILOSEC) 20 MG capsule Take 20 mg by mouth daily.    . ondansetron (ZOFRAN ODT) 8 MG disintegrating tablet Take 1 tablet (8 mg total) by mouth every 8 (eight) hours as needed. (Patient not taking: Reported on 05/13/2018) 6 tablet 0   No current facility-administered medications for this visit.     Allergies as of 08/11/2018  . (No Known Allergies)    Review of Systems:    All systems reviewed and negative except where noted in HPI.   Observations/Objective:  Labs: CMP     Component Value Date/Time   NA 137 05/05/2018 2023   K 4.2 05/05/2018 2023   CL 103 05/05/2018 2023   CO2 25 05/05/2018 2023   GLUCOSE 129 (H) 05/05/2018 2023   BUN 41 (H) 05/05/2018 2023   CREATININE 0.84 05/05/2018 2023   CALCIUM 8.4 (L) 05/05/2018 2023   PROT 6.3 05/13/2018 1202   ALBUMIN 4.1 05/13/2018 1202   AST 27 05/13/2018 1202   ALT 61 (H) 05/13/2018 1202   ALKPHOS 65 05/13/2018 1202   BILITOT  0.5 05/13/2018 1202   GFRNONAA >60 05/05/2018 2023   GFRAA >60 05/05/2018 2023   Lab Results  Component Value Date   WBC 8.3 05/05/2018   HGB 14.2 05/05/2018   HCT 41.7 05/05/2018   MCV 90.3 05/05/2018   PLT 280 05/05/2018    Imaging Studies: No results found.  Assessment and Plan:   Cory Blake is a 48 y.o. y/o male with elevated liver enzymes in January 2020, and 3-day history of melena at that time that self resolved  Assessment and Plan: EGD in February 2020 did not reveal any sources of melena He is completely  asymptomatic Hemoglobin has been previously normal  We again discussed a colonoscopy both for screening, and to rule out any lesions that could have caused melena back in January 2020.  Given the COVID pandemic, patient states he would like to wait toward the later part of the year to schedule his colonoscopy.  He understands the risks of an underlying lesion in his colon that could have led to the melena back in January 2020.  We will schedule follow-up in 3 months to rediscuss colonoscopy and schedule at that time if patient is agreeable  In the meantime, we can repeat his hemoglobin and liver enzymes to ensure that they are normal.    He is also questioning that since he had pneumonia and fever in January 2020 could he have had COVID.  Again he does not have any cough, fever, shortness of breath or any symptoms since then, including now.  He has an upcoming appointment with his primary care provider, and I have messaged her in this regard as well.  I will defer to primary care provider for COVID antibody testing to see if he has evidence of previous infection. (I was able to reach his PCP via epic messaging and they replied back.  As per her, COVID antibody testing is not recommended at this time.  Patient was informed of this and can follow-up with PCP with any further questions in this regard).     Follow Up Instructions: Clinic follow-up in 3 months Obtain labs ordered.  Patient would like these done when he goes to his primary care provider office so he can get all his labs done for PCP and [place] at the same time   I discussed the assessment and treatment plan with the patient. The patient was provided an opportunity to ask questions and all were answered. The patient agreed with the plan and demonstrated an understanding of the instructions.   The patient was advised to call back or seek an in-person evaluation if the symptoms worsen or if the condition fails to improve as anticipated.  I  provided 15 minutes of face-to-face time via video software during this encounter.   [name], MD  Speech recognition software was used to dictate this note.

## 2018-09-17 ENCOUNTER — Other Ambulatory Visit: Payer: Self-pay

## 2018-09-17 ENCOUNTER — Ambulatory Visit (INDEPENDENT_AMBULATORY_CARE_PROVIDER_SITE_OTHER): Payer: BC Managed Care – PPO | Admitting: Internal Medicine

## 2018-09-17 ENCOUNTER — Encounter: Payer: Self-pay | Admitting: Internal Medicine

## 2018-09-17 DIAGNOSIS — E785 Hyperlipidemia, unspecified: Secondary | ICD-10-CM | POA: Diagnosis not present

## 2018-09-17 DIAGNOSIS — Z20828 Contact with and (suspected) exposure to other viral communicable diseases: Secondary | ICD-10-CM

## 2018-09-17 DIAGNOSIS — Z1329 Encounter for screening for other suspected endocrine disorder: Secondary | ICD-10-CM

## 2018-09-17 DIAGNOSIS — J309 Allergic rhinitis, unspecified: Secondary | ICD-10-CM | POA: Insufficient documentation

## 2018-09-17 DIAGNOSIS — R748 Abnormal levels of other serum enzymes: Secondary | ICD-10-CM | POA: Insufficient documentation

## 2018-09-17 DIAGNOSIS — Z0000 Encounter for general adult medical examination without abnormal findings: Secondary | ICD-10-CM

## 2018-09-17 DIAGNOSIS — E559 Vitamin D deficiency, unspecified: Secondary | ICD-10-CM | POA: Diagnosis not present

## 2018-09-17 DIAGNOSIS — Z20822 Contact with and (suspected) exposure to covid-19: Secondary | ICD-10-CM

## 2018-09-17 DIAGNOSIS — R739 Hyperglycemia, unspecified: Secondary | ICD-10-CM

## 2018-09-17 DIAGNOSIS — Z1389 Encounter for screening for other disorder: Secondary | ICD-10-CM

## 2018-09-17 NOTE — Patient Instructions (Signed)

## 2018-09-17 NOTE — Progress Notes (Addendum)
Virtual Visit via Video Note  I connected with Cory Blake   on 09/17/18 at  9:35 AM EDT by a video enabled telemedicine application and verified that I am speaking with the correct person using two identifiers.  Location patient: home Location provider:work Persons participating in the virtual visit: patient, provider  I discussed the limitations of evaluation and management by telemedicine and the availability of in person appointments. The patient expressed understanding and agreed to proceed.   HPI: Hyperlipidemia-h/o HLD he reports tries to eat right and exercise   Exposure to Covid-19 Virus - pt ? If had COVID 19 04/2018 when sick pneumonia, fever, elevated lfts, reduced po and nausea and wants covid 19 ab testing. He was sick 04/2018 with fever, pneumonia, vomitting x 2 days, bloody stools, reduced oral intake x 1 week GI did EGD and was negative   Elevated liver enzymes wants labs rechecked   Allergic rhinitis trigger cats but has bengal cat hypoallergenic     ROS: See pertinent positives and negatives per HPI. General: weight stable HEENT: no sore throat  CV: no chest pain  Lungs: no sob  Ab: no n/v/d or blood in stool  MSK: no joint pain  Neuro: no h/a  Psych: no depression/anxiety  Past Medical History:  Diagnosis Date  . Allergic rhinitis    cat  . Elevated liver enzymes     Past Surgical History:  Procedure Laterality Date  . ESOPHAGOGASTRODUODENOSCOPY (EGD) WITH PROPOFOL N/A 05/15/2018   Procedure: ESOPHAGOGASTRODUODENOSCOPY (EGD) WITH PROPOFOL;  Surgeon: [name], MD;  Location: [facility] ENDOSCOPY;  Service: Endoscopy;  Laterality: N/A;  . GASTRIC FUNDOPLICATION  [id number]   for hiatal hernia  . WISDOM TOOTH EXTRACTION      Family History  Problem Relation Age of Onset  . Kidney failure Father        on HD  . Heart disease Father        had valve surgery  . Heart disease Sister        leaky valve s/p surgery congenital heart issues  . Dementia  Maternal Grandmother   . Diabetes Paternal Grandmother   . 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  EXAM:  VITALS per patient if applicable:  GENERAL: alert, oriented, appears well and in no acute distress  HEENT: atraumatic, conjunttiva clear, no obvious abnormalities on inspection of external nose and ears  NECK: normal movements of the head and neck  LUNGS: on inspection no signs of respiratory distress, breathing rate appears normal, no obvious gross SOB, gasping or wheezing  CV: no obvious cyanosis  MS: moves all visible extremities without noticeable abnormality  PSYCH/NEURO: pleasant and cooperative, no obvious depression or anxiety, speech and thought processing grossly intact  ASSESSMENT AND PLAN:  Discussed the following assessment and plan:  Hyperlipidemia, unspecified hyperlipidemia type - Plan: Lipid panel  Hyperglycemia - Plan: Hemoglobin A1c  Exposure to Covid-19 Virus - Plan: SAR CoV2 Serology (COVID 19)AB(IGG)IA pt ? If had COVID 19 04/2018 when sick pneumonia, fever, elevated lfts, reduced po and nausea and wants covid 19 ab testing  Elevated liver enzymes  Allergic rhinitis, unspecified seasonality, unspecified trigger  HM Denies vaccines against his religion (I.e  flu, Tdap)  MMR and hep B immune per pt  Denies smoking drugs occas etoh  Colonoscopy age 73 y.o and PSA  sch fasting labs     I discussed the assessment and treatment plan with the patient. The patient was provided an opportunity to ask questions and all were answered. The patient agreed with the plan and demonstrated an understanding of the instructions.   The patient was advised to call back or seek an in-person evaluation if the symptoms  worsen or if the condition fails to improve as anticipated.  Time spent 25 minutes  Delorise Jackson, MD

## 2018-10-16 DIAGNOSIS — E785 Hyperlipidemia, unspecified: Secondary | ICD-10-CM | POA: Diagnosis not present

## 2018-10-16 DIAGNOSIS — Z0000 Encounter for general adult medical examination without abnormal findings: Secondary | ICD-10-CM | POA: Diagnosis not present

## 2018-10-16 DIAGNOSIS — Z1329 Encounter for screening for other suspected endocrine disorder: Secondary | ICD-10-CM | POA: Diagnosis not present

## 2018-10-16 DIAGNOSIS — R739 Hyperglycemia, unspecified: Secondary | ICD-10-CM | POA: Diagnosis not present

## 2018-10-16 DIAGNOSIS — R748 Abnormal levels of other serum enzymes: Secondary | ICD-10-CM | POA: Diagnosis not present

## 2018-10-16 DIAGNOSIS — E559 Vitamin D deficiency, unspecified: Secondary | ICD-10-CM | POA: Diagnosis not present

## 2018-10-17 ENCOUNTER — Telehealth: Payer: Self-pay | Admitting: Internal Medicine

## 2018-10-17 ENCOUNTER — Other Ambulatory Visit: Payer: Self-pay | Admitting: Internal Medicine

## 2018-10-17 ENCOUNTER — Encounter: Payer: Self-pay | Admitting: Internal Medicine

## 2018-10-17 DIAGNOSIS — E559 Vitamin D deficiency, unspecified: Secondary | ICD-10-CM | POA: Insufficient documentation

## 2018-10-17 DIAGNOSIS — R7303 Prediabetes: Secondary | ICD-10-CM | POA: Insufficient documentation

## 2018-10-17 LAB — COMPREHENSIVE METABOLIC PANEL
ALT: 17 IU/L (ref 0–44)
AST: 18 IU/L (ref 0–40)
Albumin/Globulin Ratio: 1.6 (ref 1.2–2.2)
Albumin: 4.2 g/dL (ref 4.0–5.0)
Alkaline Phosphatase: 78 IU/L (ref 39–117)
BUN/Creatinine Ratio: 11 (ref 9–20)
BUN: 11 mg/dL (ref 6–24)
Bilirubin Total: 0.7 mg/dL (ref 0.0–1.2)
CO2: 24 mmol/L (ref 20–29)
Calcium: 9.4 mg/dL (ref 8.7–10.2)
Chloride: 103 mmol/L (ref 96–106)
Creatinine, Ser: 0.96 mg/dL (ref 0.76–1.27)
GFR calc Af Amer: 108 mL/min/{1.73_m2} (ref >59)
GFR calc non Af Amer: 93 mL/min/{1.73_m2} (ref >59)
Globulin, Total: 2.7 g/dL (ref 1.5–4.5)
Glucose: 84 mg/dL (ref 65–99)
Potassium: 4.8 mmol/L (ref 3.5–5.2)
Sodium: 141 mmol/L (ref 134–144)
Total Protein: 6.9 g/dL (ref 6.0–8.5)

## 2018-10-17 LAB — CBC WITH DIFFERENTIAL/PLATELET
Basophils Absolute: 0.1 10*3/uL (ref 0.0–0.2)
Basos: 1 %
EOS (ABSOLUTE): 2 10*3/uL — ABNORMAL HIGH (ref 0.0–0.4)
Eos: 22 %
Hematocrit: 50.4 % (ref 37.5–51.0)
Hemoglobin: 16.7 g/dL (ref 13.0–17.7)
Immature Grans (Abs): 0 10*3/uL (ref 0.0–0.1)
Immature Granulocytes: 0 %
Lymphocytes Absolute: 2.3 10*3/uL (ref 0.7–3.1)
Lymphs: 25 %
MCH: 29 pg (ref 26.6–33.0)
MCHC: 33.1 g/dL (ref 31.5–35.7)
MCV: 88 fL (ref 79–97)
Monocytes Absolute: 0.6 10*3/uL (ref 0.1–0.9)
Monocytes: 7 %
Neutrophils Absolute: 4.1 10*3/uL (ref 1.4–7.0)
Neutrophils: 45 %
Platelets: 325 10*3/uL (ref 150–450)
RBC: 5.76 x10E6/uL (ref 4.14–5.80)
RDW: 15.8 % — ABNORMAL HIGH (ref 11.6–15.4)
WBC: 9.2 10*3/uL (ref 3.4–10.8)

## 2018-10-17 LAB — LIPID PANEL
Chol/HDL Ratio: 7.2 ratio — ABNORMAL HIGH (ref 0.0–5.0)
Cholesterol, Total: 229 mg/dL — ABNORMAL HIGH (ref 100–199)
HDL: 32 mg/dL — ABNORMAL LOW (ref >39)
LDL Calculated: 154 mg/dL — ABNORMAL HIGH (ref 0–99)
Triglycerides: 217 mg/dL — ABNORMAL HIGH (ref 0–149)
VLDL Cholesterol Cal: 43 mg/dL — ABNORMAL HIGH (ref 5–40)

## 2018-10-17 LAB — HEPATIC FUNCTION PANEL
ALT: 16 IU/L (ref 0–44)
AST: 19 IU/L (ref 0–40)
Albumin: 4.4 g/dL (ref 4.0–5.0)
Alkaline Phosphatase: 79 IU/L (ref 39–117)
Bilirubin Total: 0.7 mg/dL (ref 0.0–1.2)
Bilirubin, Direct: 0.14 mg/dL (ref 0.00–0.40)
Total Protein: 7 g/dL (ref 6.0–8.5)

## 2018-10-17 LAB — URINALYSIS, ROUTINE W REFLEX MICROSCOPIC
Bilirubin, UA: NEGATIVE
Glucose, UA: NEGATIVE
Ketones, UA: NEGATIVE
Leukocytes,UA: NEGATIVE
Nitrite, UA: NEGATIVE
Protein,UA: NEGATIVE
RBC, UA: NEGATIVE
Specific Gravity, UA: 1.021 (ref 1.005–1.030)
Urobilinogen, Ur: 0.2 mg/dL (ref 0.2–1.0)
pH, UA: 6.5 (ref 5.0–7.5)

## 2018-10-17 LAB — SAR COV2 SEROLOGY (COVID19)AB(IGG),IA: SARS-CoV-2 Ab, IgG: NEGATIVE

## 2018-10-17 LAB — VITAMIN D 25 HYDROXY (VIT D DEFICIENCY, FRACTURES): Vit D, 25-Hydroxy: 17.9 ng/mL — ABNORMAL LOW (ref 30.0–100.0)

## 2018-10-17 LAB — HEMOGLOBIN A1C
Est. average glucose Bld gHb Est-mCnc: 126 mg/dL
Hgb A1c MFr Bld: 6 % — ABNORMAL HIGH (ref 4.8–5.6)

## 2018-10-17 LAB — HEMOGLOBIN: Hemoglobin: 16.7 g/dL (ref 13.0–17.7)

## 2018-10-17 LAB — TSH: TSH: 2.02 u[IU]/mL (ref 0.450–4.500)

## 2018-10-17 LAB — T4, FREE: Free T4: 1.2 ng/dL (ref 0.82–1.77)

## 2018-10-17 MED ORDER — CHOLECALCIFEROL 1.25 MG (50000 UT) PO CAPS: 50000.0000 [IU] | ORAL_CAPSULE | ORAL | 1 refills | Status: DC

## 2018-10-17 NOTE — Telephone Encounter (Signed)
Pt given results and documented in result note 

## 2018-10-17 NOTE — Telephone Encounter (Unsigned)
Copied from [place] [id number]. Topic: Quick Communication - Lab Results (Clinic Use ONLY) >> Oct 17, 2018  4:47 PM Mcneil, Ja-Kwan wrote: Pt called back for lab results. Pt requests call back. Cb# [phone]

## 2018-11-11 ENCOUNTER — Ambulatory Visit: Payer: BLUE CROSS/BLUE SHIELD | Admitting: Gastroenterology

## 2018-12-30 ENCOUNTER — Ambulatory Visit: Payer: BC Managed Care – PPO | Admitting: Internal Medicine

## 2019-02-04 DIAGNOSIS — S13110A Subluxation of C0/C1 cervical vertebrae, initial encounter: Secondary | ICD-10-CM | POA: Diagnosis not present

## 2019-02-04 DIAGNOSIS — S335XXD Sprain of ligaments of lumbar spine, subsequent encounter: Secondary | ICD-10-CM | POA: Diagnosis not present

## 2019-02-04 DIAGNOSIS — S33140A Subluxation of L4/L5 lumbar vertebra, initial encounter: Secondary | ICD-10-CM | POA: Diagnosis not present

## 2019-02-04 DIAGNOSIS — M545 Low back pain: Secondary | ICD-10-CM | POA: Diagnosis not present

## 2019-02-04 DIAGNOSIS — M62830 Muscle spasm of back: Secondary | ICD-10-CM | POA: Diagnosis not present

## 2019-02-04 DIAGNOSIS — M47812 Spondylosis without myelopathy or radiculopathy, cervical region: Secondary | ICD-10-CM | POA: Diagnosis not present

## 2019-02-11 DIAGNOSIS — S335XXD Sprain of ligaments of lumbar spine, subsequent encounter: Secondary | ICD-10-CM | POA: Diagnosis not present

## 2019-02-11 DIAGNOSIS — M62830 Muscle spasm of back: Secondary | ICD-10-CM | POA: Diagnosis not present

## 2019-02-11 DIAGNOSIS — S13110A Subluxation of C0/C1 cervical vertebrae, initial encounter: Secondary | ICD-10-CM | POA: Diagnosis not present

## 2019-02-11 DIAGNOSIS — S33140A Subluxation of L4/L5 lumbar vertebra, initial encounter: Secondary | ICD-10-CM | POA: Diagnosis not present

## 2019-02-16 DIAGNOSIS — S335XXD Sprain of ligaments of lumbar spine, subsequent encounter: Secondary | ICD-10-CM | POA: Diagnosis not present

## 2019-02-16 DIAGNOSIS — S13110A Subluxation of C0/C1 cervical vertebrae, initial encounter: Secondary | ICD-10-CM | POA: Diagnosis not present

## 2019-02-16 DIAGNOSIS — S33140A Subluxation of L4/L5 lumbar vertebra, initial encounter: Secondary | ICD-10-CM | POA: Diagnosis not present

## 2019-02-16 DIAGNOSIS — M62830 Muscle spasm of back: Secondary | ICD-10-CM | POA: Diagnosis not present

## 2019-02-18 DIAGNOSIS — M62830 Muscle spasm of back: Secondary | ICD-10-CM | POA: Diagnosis not present

## 2019-02-18 DIAGNOSIS — S33140A Subluxation of L4/L5 lumbar vertebra, initial encounter: Secondary | ICD-10-CM | POA: Diagnosis not present

## 2019-02-18 DIAGNOSIS — S13110A Subluxation of C0/C1 cervical vertebrae, initial encounter: Secondary | ICD-10-CM | POA: Diagnosis not present

## 2019-02-18 DIAGNOSIS — S335XXD Sprain of ligaments of lumbar spine, subsequent encounter: Secondary | ICD-10-CM | POA: Diagnosis not present

## 2019-02-20 DIAGNOSIS — M62830 Muscle spasm of back: Secondary | ICD-10-CM | POA: Diagnosis not present

## 2019-02-20 DIAGNOSIS — S33140A Subluxation of L4/L5 lumbar vertebra, initial encounter: Secondary | ICD-10-CM | POA: Diagnosis not present

## 2019-02-20 DIAGNOSIS — S13110A Subluxation of C0/C1 cervical vertebrae, initial encounter: Secondary | ICD-10-CM | POA: Diagnosis not present

## 2019-02-20 DIAGNOSIS — S335XXD Sprain of ligaments of lumbar spine, subsequent encounter: Secondary | ICD-10-CM | POA: Diagnosis not present

## 2019-02-23 DIAGNOSIS — S13110A Subluxation of C0/C1 cervical vertebrae, initial encounter: Secondary | ICD-10-CM | POA: Diagnosis not present

## 2019-02-23 DIAGNOSIS — S33140A Subluxation of L4/L5 lumbar vertebra, initial encounter: Secondary | ICD-10-CM | POA: Diagnosis not present

## 2019-02-23 DIAGNOSIS — M62830 Muscle spasm of back: Secondary | ICD-10-CM | POA: Diagnosis not present

## 2019-02-23 DIAGNOSIS — S335XXD Sprain of ligaments of lumbar spine, subsequent encounter: Secondary | ICD-10-CM | POA: Diagnosis not present

## 2019-02-25 ENCOUNTER — Telehealth: Payer: Self-pay | Admitting: Internal Medicine

## 2019-02-25 DIAGNOSIS — S33140A Subluxation of L4/L5 lumbar vertebra, initial encounter: Secondary | ICD-10-CM | POA: Diagnosis not present

## 2019-02-25 DIAGNOSIS — S335XXD Sprain of ligaments of lumbar spine, subsequent encounter: Secondary | ICD-10-CM | POA: Diagnosis not present

## 2019-02-25 DIAGNOSIS — M62830 Muscle spasm of back: Secondary | ICD-10-CM | POA: Diagnosis not present

## 2019-02-25 DIAGNOSIS — S13110A Subluxation of C0/C1 cervical vertebrae, initial encounter: Secondary | ICD-10-CM | POA: Diagnosis not present

## 2019-02-25 NOTE — Telephone Encounter (Signed)
I called pt twice and left vm to call ofc. °

## 2019-02-26 ENCOUNTER — Other Ambulatory Visit: Payer: Self-pay

## 2019-02-26 ENCOUNTER — Ambulatory Visit (INDEPENDENT_AMBULATORY_CARE_PROVIDER_SITE_OTHER): Payer: BC Managed Care – PPO | Admitting: Internal Medicine

## 2019-02-26 ENCOUNTER — Encounter: Payer: Self-pay | Admitting: Internal Medicine

## 2019-02-26 VITALS — Ht 71.5 in | Wt 200.0 lb

## 2019-02-26 DIAGNOSIS — J9801 Acute bronchospasm: Secondary | ICD-10-CM | POA: Diagnosis not present

## 2019-02-26 DIAGNOSIS — E785 Hyperlipidemia, unspecified: Secondary | ICD-10-CM

## 2019-02-26 DIAGNOSIS — E559 Vitamin D deficiency, unspecified: Secondary | ICD-10-CM | POA: Diagnosis not present

## 2019-02-26 DIAGNOSIS — J40 Bronchitis, not specified as acute or chronic: Secondary | ICD-10-CM

## 2019-02-26 DIAGNOSIS — R7303 Prediabetes: Secondary | ICD-10-CM

## 2019-02-26 MED ORDER — ALBUTEROL SULFATE HFA 108 (90 BASE) MCG/ACT IN AERS: 1.0000 | INHALATION_SPRAY | Freq: Four times a day (QID) | RESPIRATORY_TRACT | 11 refills | Status: AC | PRN

## 2019-02-26 NOTE — Patient Instructions (Signed)
D3 4000 Iu daily  High Cholesterol  High cholesterol is a condition in which the blood has high levels of a white, waxy, fat-like substance (cholesterol). The human body needs small amounts of cholesterol. The liver makes all the cholesterol that the body needs. Extra (excess) cholesterol comes from the food that we eat. Cholesterol is carried from the liver by the blood through the blood vessels. If you have high cholesterol, deposits (plaques) may build up on the walls of your blood vessels (arteries). Plaques make the arteries narrower and stiffer. Cholesterol plaques increase your risk for heart attack and stroke. Work with your health care provider to keep your cholesterol levels in a healthy range. What increases the risk? This condition is more likely to develop in people who:  Eat foods that are high in animal fat (saturated fat) or cholesterol.  Are overweight.  Are not getting enough exercise.  Have a family history of high cholesterol. What are the signs or symptoms? There are no symptoms of this condition. How is this diagnosed? This condition may be diagnosed from the results of a blood test.  If you are older than age 70, your health care provider may check your cholesterol every 4-6 years.  You may be checked more often if you already have high cholesterol or other risk factors for heart disease. The blood test for cholesterol measures:  "Bad" cholesterol (LDL cholesterol). This is the main type of cholesterol that causes heart disease. The desired level for LDL is less than 100.  "Good" cholesterol (HDL cholesterol). This type helps to protect against heart disease by cleaning the arteries and carrying the LDL away. The desired level for HDL is 60 or higher.  Triglycerides. These are fats that the body can store or burn for energy. The desired number for triglycerides is lower than 150.  Total cholesterol. This is a measure of the total amount of cholesterol in your  blood, including LDL cholesterol, HDL cholesterol, and triglycerides. A healthy number is less than 200. How is this treated? This condition is treated with diet changes, lifestyle changes, and medicines. Diet changes  This may include eating more whole grains, fruits, vegetables, nuts, and fish.  This may also include cutting back on red meat and foods that have a lot of added sugar. Lifestyle changes  Changes may include getting at least 40 minutes of aerobic exercise 3 times a week. Aerobic exercises include walking, biking, and swimming. Aerobic exercise along with a healthy diet can help you maintain a healthy weight.  Changes may also include quitting smoking. Medicines  Medicines are usually given if diet and lifestyle changes have failed to reduce your cholesterol to healthy levels.  Your health care provider may prescribe a statin medicine. Statin medicines have been shown to reduce cholesterol, which can reduce the risk of heart disease. Follow these instructions at home: Eating and drinking If told by your health care provider:  Eat chicken (without skin), fish, veal, shellfish, ground [place] breast, and round or loin cuts of red meat.  Do not eat fried foods or fatty meats, such as hot dogs and salami.  Eat plenty of fruits, such as apples.  Eat plenty of vegetables, such as broccoli, potatoes, and carrots.  Eat beans, peas, and lentils.  Eat grains such as barley, rice, couscous, and bulgur wheat.  Eat pasta without cream sauces.  Use skim or nonfat milk, and eat low-fat or nonfat yogurt and cheeses.  Do not eat or drink whole milk,  cream, ice cream, egg yolks, or hard cheeses.  Do not eat stick margarine or tub margarines that contain trans fats (also called partially hydrogenated oils).  Do not eat saturated tropical oils, such as coconut oil and palm oil.  Do not eat cakes, cookies, crackers, or other baked goods that contain trans fats.  General  instructions  Exercise as directed by your health care provider. Increase your activity level with activities such as gardening, walking, and taking the stairs.  Take over-the-counter and prescription medicines only as told by your health care provider.  Do not use any products that contain nicotine or tobacco, such as cigarettes and e-cigarettes. If you need help quitting, ask your health care provider.  Keep all follow-up visits as told by your health care provider. This is important. Contact a health care provider if:  You are struggling to maintain a healthy diet or weight.  You need help to start on an exercise program.  You need help to stop smoking. Get help right away if:  You have chest pain.  You have trouble breathing. This information is not intended to replace advice given to you by your health care provider. Make sure you discuss any questions you have with your health care provider. Document Released: 03/26/2005 Document Revised: 03/29/2017 Document Reviewed: 09/24/2015 Elsevier Patient Education  [place].  Cholesterol Content in Foods Cholesterol is a waxy, fat-like substance that helps to carry fat in the blood. The body needs cholesterol in small amounts, but too much cholesterol can cause damage to the arteries and heart. Most people should eat less than 200 milligrams (mg) of cholesterol a day. Foods with cholesterol  Cholesterol is found in animal-based foods, such as meat, seafood, and dairy. Generally, low-fat dairy and lean meats have less cholesterol than full-fat dairy and fatty meats. The milligrams of cholesterol per serving (mg per serving) of common cholesterol-containing foods are listed below. Meat and other proteins  Egg - one large whole egg has 186 mg.  Veal shank - 4 oz has 141 mg.  Lean ground [place] (93% lean) - 4 oz has 118 mg.  Fat-trimmed lamb loin - 4 oz has 106 mg.  Lean ground beef (90% lean) - 4 oz has 100 mg.  Lobster  - 3.5 oz has 90 mg.  Pork loin chops - 4 oz has 86 mg.  Canned salmon - 3.5 oz has 83 mg.  Fat-trimmed beef top loin - 4 oz has 78 mg.  Frankfurter - 1 frank (3.5 oz) has 77 mg.  Crab - 3.5 oz has 71 mg.  Roasted chicken without skin, white meat - 4 oz has 66 mg.  Light bologna - 2 oz has 45 mg.  Deli-cut [place] - 2 oz has 31 mg.  Canned tuna - 3.5 oz has 31 mg.  Bacon - 1 oz has 29 mg.  Oysters and mussels (raw) - 3.5 oz has 25 mg.  Mackerel - 1 oz has 22 mg.  Trout - 1 oz has 20 mg.  Pork sausage - 1 link (1 oz) has 17 mg.  Salmon - 1 oz has 16 mg.  Tilapia - 1 oz has 14 mg. Dairy  Soft-serve ice cream -  cup (4 oz) has 103 mg.  Whole-milk yogurt - 1 cup (8 oz) has 29 mg.  Cheddar cheese - 1 oz has 28 mg.  American cheese - 1 oz has 28 mg.  Whole milk - 1 cup (8 oz) has 23 mg.  2%  milk - 1 cup (8 oz) has 18 mg.  Cream cheese - 1 tablespoon (Tbsp) has 15 mg.  Cottage cheese -  cup (4 oz) has 14 mg.  Low-fat (1%) milk - 1 cup (8 oz) has 10 mg.  Sour cream - 1 Tbsp has 8.5 mg.  Low-fat yogurt - 1 cup (8 oz) has 8 mg.  Nonfat Greek yogurt - 1 cup (8 oz) has 7 mg.  Half-and-half cream - 1 Tbsp has 5 mg. Fats and oils  Cod liver oil - 1 tablespoon (Tbsp) has 82 mg.  Butter - 1 Tbsp has 15 mg.  Lard - 1 Tbsp has 14 mg.  Bacon grease - 1 Tbsp has 14 mg.  Mayonnaise - 1 Tbsp has 5-10 mg.  Margarine - 1 Tbsp has 3-10 mg. Exact amounts of cholesterol in these foods may vary depending on specific ingredients and brands. Foods without cholesterol Most plant-based foods do not have cholesterol unless you combine them with a food that has cholesterol. Foods without cholesterol include:  Grains and cereals.  Vegetables.  Fruits.  Vegetable oils, such as olive, canola, and sunflower oil.  Legumes, such as peas, beans, and lentils.  Nuts and seeds.  Egg whites. Summary  The body needs cholesterol in small amounts, but too much cholesterol  can cause damage to the arteries and heart.  Most people should eat less than 200 milligrams (mg) of cholesterol a day. This information is not intended to replace advice given to you by your health care provider. Make sure you discuss any questions you have with your health care provider. Document Released: 11/20/2016 Document Revised: 03/08/2017 Document Reviewed: 11/20/2016 Elsevier Patient Education  2020 [place].  Preventing Type 2 Diabetes Mellitus Type 2 diabetes (type 2 diabetes mellitus) is a long-term (chronic) disease that affects blood sugar (glucose) levels. Normally, a hormone called insulin allows glucose to enter cells in the body. The cells use glucose for energy. In type 2 diabetes, one or both of these problems may be present:  The body does not make enough insulin.  The body does not respond properly to insulin that it makes (insulin resistance). Insulin resistance or lack of insulin causes excess glucose to build up in the blood instead of going into cells. As a result, high blood glucose (hyperglycemia) develops, which can cause many complications. Being overweight or obese and having an inactive (sedentary) lifestyle can increase your risk for diabetes. Type 2 diabetes can be delayed or prevented by making certain nutrition and lifestyle changes. What nutrition changes can be made?   Eat healthy meals and snacks regularly. Keep a healthy snack with you for when you get hungry between meals, such as fruit or a handful of nuts.  Eat lean meats and proteins that are low in saturated fats, such as chicken, fish, egg whites, and beans. Avoid processed meats.  Eat plenty of fruits and vegetables and plenty of grains that have not been processed (whole grains). It is recommended that you eat: ? 1?2 cups of fruit every day. ? 2?3 cups of vegetables every day. ? 6?8 oz of whole grains every day, such as oats, whole wheat, bulgur, brown rice, quinoa, and millet.  Eat  low-fat dairy products, such as milk, yogurt, and cheese.  Eat foods that contain healthy fats, such as nuts, avocado, olive oil, and canola oil.  Drink water throughout the day. Avoid drinks that contain added sugar, such as soda or sweet tea.  Follow instructions from your health  care provider about specific eating or drinking restrictions.  Control how much food you eat at a time (portion size). ? Check food labels to find out the serving sizes of foods. ? Use a kitchen scale to weigh amounts of foods.  Saute or steam food instead of frying it. Cook with water or broth instead of oils or butter.  Limit your intake of: ? Salt (sodium). Have no more than 1 tsp (2,400 mg) of sodium a day. If you have heart disease or high blood pressure, have less than ? tsp (1,500 mg) of sodium a day. ? Saturated fat. This is fat that is solid at room temperature, such as butter or fat on meat. What lifestyle changes can be made? Activity   Do moderate-intensity physical activity for at least 30 minutes on at least 5 days of the week, or as much as told by your health care provider.  Ask your health care provider what activities are safe for you. A mix of physical activities may be best, such as walking, swimming, cycling, and strength training.  Try to add physical activity into your day. For example: ? Park in spots that are farther away than usual, so that you walk more. For example, park in a far corner of the parking lot when you go to the office or the grocery store. ? Take a walk during your lunch break. ? Use stairs instead of elevators or escalators. Weight Loss  Lose weight as directed. Your health care provider can determine how much weight loss is best for you and can help you lose weight safely.  If you are overweight or obese, you may be instructed to lose at least 5?7 % of your body weight. Alcohol and Tobacco   Limit alcohol intake to no more than 1 drink a day for nonpregnant  women and 2 drinks a day for men. One drink equals 12 oz of beer, 5 oz of wine, or 1 oz of hard liquor.  Do not use any tobacco products, such as cigarettes, chewing tobacco, and e-cigarettes. If you need help quitting, ask your health care provider. Work With [place] Provider  Have your blood glucose tested regularly, as told by your health care provider.  Discuss your risk factors and how you can reduce your risk for diabetes.  Get screening tests as told by your health care provider. You may have screening tests regularly, especially if you have certain risk factors for type 2 diabetes.  Make an appointment with a diet and nutrition specialist (registered dietitian). A registered dietitian can help you make a healthy eating plan and can help you understand portion sizes and food labels. Why are these changes important?  It is possible to prevent or delay type 2 diabetes and related health problems by making lifestyle and nutrition changes.  It can be difficult to recognize signs of type 2 diabetes. The best way to avoid possible damage to your body is to take actions to prevent the disease before you develop symptoms. What can happen if changes are not made?  Your blood glucose levels may keep increasing. Having high blood glucose for a long time is dangerous. Too much glucose in your blood can damage your blood vessels, heart, kidneys, nerves, and eyes.  You may develop prediabetes or type 2 diabetes. Type 2 diabetes can lead to many chronic health problems and complications, such as: ? Heart disease. ? Stroke. ? Blindness. ? Kidney disease. ? Depression. ? Poor circulation in  the feet and legs, which could lead to surgical removal (amputation) in severe cases. Where to find support  Ask your health care provider to recommend a registered dietitian, diabetes educator, or weight loss program.  Look for local or online weight loss groups.  Join a gym, fitness club, or  outdoor activity group, such as a walking club. Where to find more information To learn more about diabetes and diabetes prevention, visit:  American Diabetes Association (ADA): www.diabetes.[employer] of Diabetes and Digestive and Kidney Diseases: FindSpin.nl To learn more about healthy eating, visit:  The U.S. Department of Agriculture Scientist, research (physical sciences)), Choose My Plate: http://wiley-williams.com/  Office of Disease Prevention and Health Promotion (ODPHP), Dietary Guidelines: SurferLive.at Summary  You can reduce your risk for type 2 diabetes by increasing your physical activity, eating healthy foods, and losing weight as directed.  Talk with your health care provider about your risk for type 2 diabetes. Ask about any blood tests or screening tests that you need to have. This information is not intended to replace advice given to you by your health care provider. Make sure you discuss any questions you have with your health care provider. Document Released: 07/18/2015 Document Revised: 07/18/2018 Document Reviewed: 05/17/2015 Elsevier Patient Education  2020 [place].  Prediabetes Eating Plan Prediabetes is a condition that causes blood sugar (glucose) levels to be higher than normal. This increases the risk for developing diabetes. In order to prevent diabetes from developing, your health care provider may recommend a diet and other lifestyle changes to help you:  Control your blood glucose levels.  Improve your cholesterol levels.  Manage your blood pressure. Your health care provider may recommend working with a diet and nutrition specialist (dietitian) to make a meal plan that is best for you. What are tips for following this plan? Lifestyle  Set weight loss goals with the help of your health care team. It is recommended that most people with prediabetes lose 7% of their current body weight.  Exercise for at  least 30 minutes at least 5 days a week.  Attend a support group or seek ongoing support from a mental health counselor.  Take over-the-counter and prescription medicines only as told by your health care provider. Reading food labels  Read food labels to check the amount of fat, salt (sodium), and sugar in prepackaged foods. Avoid foods that have: ? Saturated fats. ? Trans fats. ? Added sugars.  Avoid foods that have more than 300 milligrams (mg) of sodium per serving. Limit your daily sodium intake to less than 2,300 mg each day. Shopping  Avoid buying pre-made and processed foods. Cooking  Cook with olive oil. Do not use butter, lard, or ghee.  Bake, broil, grill, or boil foods. Avoid frying. Meal planning   Work with your dietitian to develop an eating plan that is right for you. This may include: ? Tracking how many calories you take in. Use a food diary, notebook, or mobile application to track what you eat at each meal. ? Using the glycemic index (GI) to plan your meals. The index tells you how quickly a food will raise your blood glucose. Choose low-GI foods. These foods take a longer time to raise blood glucose.  Consider following a Mediterranean diet. This diet includes: ? Several servings each day of fresh fruits and vegetables. ? Eating fish at least twice a week. ? Several servings each day of whole grains, beans, nuts, and seeds. ? Using olive oil instead of other fats. ?  Moderate alcohol consumption. ? Eating small amounts of red meat and whole-fat dairy.  If you have high blood pressure, you may need to limit your sodium intake or follow a diet such as the DASH eating plan. DASH is an eating plan that aims to lower high blood pressure. What foods are recommended? The items listed below may not be a complete list. Talk with your dietitian about what dietary choices are best for you. Grains Whole grains, such as whole-wheat or whole-grain breads, crackers,  cereals, and pasta. Unsweetened oatmeal. Bulgur. Barley. Quinoa. Brown rice. Corn or whole-wheat flour tortillas or taco shells. Vegetables Lettuce. Spinach. Peas. Beets. Cauliflower. Cabbage. Broccoli. Carrots. Tomatoes. Squash. Eggplant. Herbs. Peppers. Onions. Cucumbers. Brussels sprouts. Fruits Berries. Bananas. Apples. Oranges. Grapes. Papaya. Mango. Pomegranate. Kiwi. Grapefruit. Cherries. Meats and other protein foods Seafood. Poultry without skin. Lean cuts of pork and beef. Tofu. Eggs. Nuts. Beans. Dairy Low-fat or fat-free dairy products, such as yogurt, cottage cheese, and cheese. Beverages Water. Tea. Coffee. Sugar-free or diet soda. Seltzer water. Lowfat or no-fat milk. Milk alternatives, such as soy or almond milk. Fats and oils Olive oil. Canola oil. Sunflower oil. Grapeseed oil. Avocado. Walnuts. Sweets and desserts Sugar-free or low-fat pudding. Sugar-free or low-fat ice cream and other frozen treats. Seasoning and other foods Herbs. Sodium-free spices. Mustard. Relish. Low-fat, low-sugar ketchup. Low-fat, low-sugar barbecue sauce. Low-fat or fat-free mayonnaise. What foods are not recommended? The items listed below may not be a complete list. Talk with your dietitian about what dietary choices are best for you. Grains Refined white flour and flour products, such as bread, pasta, snack foods, and cereals. Vegetables Canned vegetables. Frozen vegetables with butter or cream sauce. Fruits Fruits canned with syrup. Meats and other protein foods Fatty cuts of meat. Poultry with skin. Breaded or fried meat. Processed meats. Dairy Full-fat yogurt, cheese, or milk. Beverages Sweetened drinks, such as sweet iced tea and soda. Fats and oils Butter. Lard. Ghee. Sweets and desserts Baked goods, such as cake, cupcakes, pastries, cookies, and cheesecake. Seasoning and other foods Spice mixes with added salt. Ketchup. Barbecue sauce. Mayonnaise. Summary  To prevent diabetes  from developing, you may need to make diet and other lifestyle changes to help control blood sugar, improve cholesterol levels, and manage your blood pressure.  Set weight loss goals with the help of your health care team. It is recommended that most people with prediabetes lose 7 percent of their current body weight.  Consider following a Mediterranean diet that includes plenty of fresh fruits and vegetables, whole grains, beans, nuts, seeds, fish, lean meat, low-fat dairy, and healthy oils. This information is not intended to replace advice given to you by your health care provider. Make sure you discuss any questions you have with your health care provider. Document Released: 08/10/2014 Document Revised: 07/18/2018 Document Reviewed: 05/30/2016 Elsevier Patient Education  2020 [employer].

## 2019-02-26 NOTE — Progress Notes (Signed)
Virtual Visit via Video Note  I connected with Cory Blake  on 02/27/19 at  4:00 PM EST by a video enabled telemedicine application and verified that I am speaking with the correct person using two identifiers.  Location patient: home Location provider:work or home office Persons participating in the virtual visit: patient, provider  I discussed the limitations of evaluation and management by telemedicine and the availability of in person appointments. The patient expressed understanding and agreed to proceed.   HPI: 1. Reviewed labs HLD,vit D def rec take D3 4000 IU, prediabetes disc healthy diet and exercise  2. H/o bronchitis wants refill of venolin inhaler to pharmacy prn use  He is not wearing a mask all the time emphasized importance  ROS: See pertinent positives and negatives per HPI.  Past Medical History:  Diagnosis Date  . Allergic rhinitis    cat  . Elevated liver enzymes     Past Surgical History:  Procedure Laterality Date  . ESOPHAGOGASTRODUODENOSCOPY (EGD) WITH PROPOFOL N/A 05/15/2018   Procedure: ESOPHAGOGASTRODUODENOSCOPY (EGD) WITH PROPOFOL;  Surgeon: [name], MD;  Location: [facility] ENDOSCOPY;  Service: Endoscopy;  Laterality: N/A;  . GASTRIC FUNDOPLICATION  4818   for hiatal hernia  . WISDOM TOOTH EXTRACTION      Family History  Problem Relation Age of Onset  . Kidney failure Father        on HD  . Heart disease Father        had valve surgery  . Heart disease Sister        leaky valve s/p surgery congenital heart issues  . Dementia Maternal Grandmother   . Diabetes Paternal Grandmother   . 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  Cholecalciferol 1.25 MG (50000 UT) capsule, Take 1 capsule (50,000 Units  total) by mouth once a week., Disp: 13 capsule, Rfl: 1 .  albuterol (VENTOLIN HFA) 108 (90 Base) MCG/ACT inhaler, Inhale 1-2 puffs into the lungs every 6 (six) hours as needed for wheezing or shortness of breath., Disp: 18 g, Rfl: 11  EXAM:  VITALS per patient if applicable:  GENERAL: alert, oriented, appears well and in no acute distress  HEENT: atraumatic, conjunttiva clear, no obvious abnormalities on inspection of external nose and ears  NECK: normal movements of the head and neck  LUNGS: on inspection no signs of respiratory distress, breathing rate appears normal, no obvious gross SOB, gasping or wheezing  CV: no obvious cyanosis  MS: moves all visible extremities without noticeable abnormality  PSYCH/NEURO: pleasant and cooperative, no obvious depression or anxiety, speech and thought processing grossly intact  ASSESSMENT AND PLAN:  Discussed the following assessment and plan:  Hyperlipidemia, unspecified hyperlipidemia type -monitor  Given info  Fasting labs in 1 or 05/2019   Bronchospasm - Plan: albuterol (VENTOLIN HFA) 108 (90 Base) MCG/ACT inhaler Bronchitis - Plan: albuterol (VENTOLIN HFA) 108 (90 Base) MCG/ACT inhaler  Vitamin D deficiency rec D3 4000 Iu qd   Prediabetes Given diet info  HM Denies vaccines against his religion (I.e flu, Tdap)  MMR and hep B immune per pt  Denies smoking drugs occas etoh  Colonoscopy age 48 y.o and PSA pt wants to hold for now given  pandemic disc could do colonoscopy 45-49 new guidelines sch fasting labs [facility] see above  -we discussed possible serious and likely etiologies, options for evaluation and workup, limitations of telemedicine visit vs in person visit, treatment, treatment risks and precautions. Pt prefers to treat via telemedicine empirically rather then risking or undertaking an in person visit at this moment. Patient agrees to seek prompt in person care if worsening, new symptoms arise, or if is not improving  with treatment.   I discussed the assessment and treatment plan with the patient. The patient was provided an opportunity to ask questions and all were answered. The patient agreed with the plan and demonstrated an understanding of the instructions.   The patient was advised to call back or seek an in-person evaluation if the symptoms worsen or if the condition fails to improve as anticipated.  Time spent 20 minutes  Delorise Jackson, MD

## 2019-02-27 ENCOUNTER — Encounter: Payer: Self-pay | Admitting: Internal Medicine

## 2019-02-27 DIAGNOSIS — S335XXD Sprain of ligaments of lumbar spine, subsequent encounter: Secondary | ICD-10-CM | POA: Diagnosis not present

## 2019-02-27 DIAGNOSIS — M62830 Muscle spasm of back: Secondary | ICD-10-CM | POA: Diagnosis not present

## 2019-02-27 DIAGNOSIS — S33140A Subluxation of L4/L5 lumbar vertebra, initial encounter: Secondary | ICD-10-CM | POA: Diagnosis not present

## 2019-02-27 DIAGNOSIS — S13110A Subluxation of C0/C1 cervical vertebrae, initial encounter: Secondary | ICD-10-CM | POA: Diagnosis not present

## 2019-03-02 DIAGNOSIS — S13110A Subluxation of C0/C1 cervical vertebrae, initial encounter: Secondary | ICD-10-CM | POA: Diagnosis not present

## 2019-03-02 DIAGNOSIS — M62830 Muscle spasm of back: Secondary | ICD-10-CM | POA: Diagnosis not present

## 2019-03-02 DIAGNOSIS — S335XXD Sprain of ligaments of lumbar spine, subsequent encounter: Secondary | ICD-10-CM | POA: Diagnosis not present

## 2019-03-02 DIAGNOSIS — S33140A Subluxation of L4/L5 lumbar vertebra, initial encounter: Secondary | ICD-10-CM | POA: Diagnosis not present

## 2019-03-03 DIAGNOSIS — M62830 Muscle spasm of back: Secondary | ICD-10-CM | POA: Diagnosis not present

## 2019-03-03 DIAGNOSIS — S33140A Subluxation of L4/L5 lumbar vertebra, initial encounter: Secondary | ICD-10-CM | POA: Diagnosis not present

## 2019-03-03 DIAGNOSIS — S335XXD Sprain of ligaments of lumbar spine, subsequent encounter: Secondary | ICD-10-CM | POA: Diagnosis not present

## 2019-03-03 DIAGNOSIS — S13110A Subluxation of C0/C1 cervical vertebrae, initial encounter: Secondary | ICD-10-CM | POA: Diagnosis not present

## 2019-03-04 DIAGNOSIS — M62830 Muscle spasm of back: Secondary | ICD-10-CM | POA: Diagnosis not present

## 2019-03-04 DIAGNOSIS — S335XXD Sprain of ligaments of lumbar spine, subsequent encounter: Secondary | ICD-10-CM | POA: Diagnosis not present

## 2019-03-04 DIAGNOSIS — S13110A Subluxation of C0/C1 cervical vertebrae, initial encounter: Secondary | ICD-10-CM | POA: Diagnosis not present

## 2019-03-04 DIAGNOSIS — S33140A Subluxation of L4/L5 lumbar vertebra, initial encounter: Secondary | ICD-10-CM | POA: Diagnosis not present

## 2019-03-09 DIAGNOSIS — M62830 Muscle spasm of back: Secondary | ICD-10-CM | POA: Diagnosis not present

## 2019-03-09 DIAGNOSIS — S33140A Subluxation of L4/L5 lumbar vertebra, initial encounter: Secondary | ICD-10-CM | POA: Diagnosis not present

## 2019-03-09 DIAGNOSIS — S13110A Subluxation of C0/C1 cervical vertebrae, initial encounter: Secondary | ICD-10-CM | POA: Diagnosis not present

## 2019-03-09 DIAGNOSIS — S335XXD Sprain of ligaments of lumbar spine, subsequent encounter: Secondary | ICD-10-CM | POA: Diagnosis not present

## 2019-03-11 DIAGNOSIS — S33140A Subluxation of L4/L5 lumbar vertebra, initial encounter: Secondary | ICD-10-CM | POA: Diagnosis not present

## 2019-03-11 DIAGNOSIS — S13110A Subluxation of C0/C1 cervical vertebrae, initial encounter: Secondary | ICD-10-CM | POA: Diagnosis not present

## 2019-03-11 DIAGNOSIS — S335XXD Sprain of ligaments of lumbar spine, subsequent encounter: Secondary | ICD-10-CM | POA: Diagnosis not present

## 2019-03-11 DIAGNOSIS — M62830 Muscle spasm of back: Secondary | ICD-10-CM | POA: Diagnosis not present

## 2019-03-13 DIAGNOSIS — S33140A Subluxation of L4/L5 lumbar vertebra, initial encounter: Secondary | ICD-10-CM | POA: Diagnosis not present

## 2019-03-13 DIAGNOSIS — M62830 Muscle spasm of back: Secondary | ICD-10-CM | POA: Diagnosis not present

## 2019-03-13 DIAGNOSIS — S335XXD Sprain of ligaments of lumbar spine, subsequent encounter: Secondary | ICD-10-CM | POA: Diagnosis not present

## 2019-03-13 DIAGNOSIS — S13110A Subluxation of C0/C1 cervical vertebrae, initial encounter: Secondary | ICD-10-CM | POA: Diagnosis not present

## 2019-03-16 DIAGNOSIS — S33140A Subluxation of L4/L5 lumbar vertebra, initial encounter: Secondary | ICD-10-CM | POA: Diagnosis not present

## 2019-03-16 DIAGNOSIS — M62830 Muscle spasm of back: Secondary | ICD-10-CM | POA: Diagnosis not present

## 2019-03-16 DIAGNOSIS — S335XXD Sprain of ligaments of lumbar spine, subsequent encounter: Secondary | ICD-10-CM | POA: Diagnosis not present

## 2019-03-16 DIAGNOSIS — S13110A Subluxation of C0/C1 cervical vertebrae, initial encounter: Secondary | ICD-10-CM | POA: Diagnosis not present

## 2019-03-18 DIAGNOSIS — S33140A Subluxation of L4/L5 lumbar vertebra, initial encounter: Secondary | ICD-10-CM | POA: Diagnosis not present

## 2019-03-18 DIAGNOSIS — M62830 Muscle spasm of back: Secondary | ICD-10-CM | POA: Diagnosis not present

## 2019-03-18 DIAGNOSIS — S13110A Subluxation of C0/C1 cervical vertebrae, initial encounter: Secondary | ICD-10-CM | POA: Diagnosis not present

## 2019-03-18 DIAGNOSIS — S335XXD Sprain of ligaments of lumbar spine, subsequent encounter: Secondary | ICD-10-CM | POA: Diagnosis not present

## 2019-03-23 DIAGNOSIS — M62830 Muscle spasm of back: Secondary | ICD-10-CM | POA: Diagnosis not present

## 2019-03-23 DIAGNOSIS — S33140A Subluxation of L4/L5 lumbar vertebra, initial encounter: Secondary | ICD-10-CM | POA: Diagnosis not present

## 2019-03-23 DIAGNOSIS — S13110A Subluxation of C0/C1 cervical vertebrae, initial encounter: Secondary | ICD-10-CM | POA: Diagnosis not present

## 2019-03-23 DIAGNOSIS — S335XXD Sprain of ligaments of lumbar spine, subsequent encounter: Secondary | ICD-10-CM | POA: Diagnosis not present

## 2019-03-25 DIAGNOSIS — S335XXD Sprain of ligaments of lumbar spine, subsequent encounter: Secondary | ICD-10-CM | POA: Diagnosis not present

## 2019-03-25 DIAGNOSIS — S33140A Subluxation of L4/L5 lumbar vertebra, initial encounter: Secondary | ICD-10-CM | POA: Diagnosis not present

## 2019-03-25 DIAGNOSIS — S13110A Subluxation of C0/C1 cervical vertebrae, initial encounter: Secondary | ICD-10-CM | POA: Diagnosis not present

## 2019-03-25 DIAGNOSIS — M62830 Muscle spasm of back: Secondary | ICD-10-CM | POA: Diagnosis not present

## 2019-03-30 DIAGNOSIS — M62830 Muscle spasm of back: Secondary | ICD-10-CM | POA: Diagnosis not present

## 2019-03-30 DIAGNOSIS — S335XXD Sprain of ligaments of lumbar spine, subsequent encounter: Secondary | ICD-10-CM | POA: Diagnosis not present

## 2019-03-30 DIAGNOSIS — S13110A Subluxation of C0/C1 cervical vertebrae, initial encounter: Secondary | ICD-10-CM | POA: Diagnosis not present

## 2019-03-30 DIAGNOSIS — S33140A Subluxation of L4/L5 lumbar vertebra, initial encounter: Secondary | ICD-10-CM | POA: Diagnosis not present

## 2019-04-01 DIAGNOSIS — S33140A Subluxation of L4/L5 lumbar vertebra, initial encounter: Secondary | ICD-10-CM | POA: Diagnosis not present

## 2019-04-01 DIAGNOSIS — S335XXD Sprain of ligaments of lumbar spine, subsequent encounter: Secondary | ICD-10-CM | POA: Diagnosis not present

## 2019-04-01 DIAGNOSIS — M62830 Muscle spasm of back: Secondary | ICD-10-CM | POA: Diagnosis not present

## 2019-04-01 DIAGNOSIS — S13110A Subluxation of C0/C1 cervical vertebrae, initial encounter: Secondary | ICD-10-CM | POA: Diagnosis not present

## 2019-04-06 DIAGNOSIS — S335XXD Sprain of ligaments of lumbar spine, subsequent encounter: Secondary | ICD-10-CM | POA: Diagnosis not present

## 2019-04-06 DIAGNOSIS — S33140A Subluxation of L4/L5 lumbar vertebra, initial encounter: Secondary | ICD-10-CM | POA: Diagnosis not present

## 2019-04-06 DIAGNOSIS — M62830 Muscle spasm of back: Secondary | ICD-10-CM | POA: Diagnosis not present

## 2019-04-06 DIAGNOSIS — S13110A Subluxation of C0/C1 cervical vertebrae, initial encounter: Secondary | ICD-10-CM | POA: Diagnosis not present

## 2019-04-08 DIAGNOSIS — S33140A Subluxation of L4/L5 lumbar vertebra, initial encounter: Secondary | ICD-10-CM | POA: Diagnosis not present

## 2019-04-08 DIAGNOSIS — S335XXD Sprain of ligaments of lumbar spine, subsequent encounter: Secondary | ICD-10-CM | POA: Diagnosis not present

## 2019-04-08 DIAGNOSIS — S13110A Subluxation of C0/C1 cervical vertebrae, initial encounter: Secondary | ICD-10-CM | POA: Diagnosis not present

## 2019-04-08 DIAGNOSIS — M62830 Muscle spasm of back: Secondary | ICD-10-CM | POA: Diagnosis not present

## 2019-04-13 DIAGNOSIS — S33140A Subluxation of L4/L5 lumbar vertebra, initial encounter: Secondary | ICD-10-CM | POA: Diagnosis not present

## 2019-04-13 DIAGNOSIS — S335XXD Sprain of ligaments of lumbar spine, subsequent encounter: Secondary | ICD-10-CM | POA: Diagnosis not present

## 2019-04-13 DIAGNOSIS — M62830 Muscle spasm of back: Secondary | ICD-10-CM | POA: Diagnosis not present

## 2019-04-13 DIAGNOSIS — S13110A Subluxation of C0/C1 cervical vertebrae, initial encounter: Secondary | ICD-10-CM | POA: Diagnosis not present

## 2019-04-16 DIAGNOSIS — S33140A Subluxation of L4/L5 lumbar vertebra, initial encounter: Secondary | ICD-10-CM | POA: Diagnosis not present

## 2019-04-16 DIAGNOSIS — S335XXD Sprain of ligaments of lumbar spine, subsequent encounter: Secondary | ICD-10-CM | POA: Diagnosis not present

## 2019-04-16 DIAGNOSIS — M62830 Muscle spasm of back: Secondary | ICD-10-CM | POA: Diagnosis not present

## 2019-04-16 DIAGNOSIS — S13110A Subluxation of C0/C1 cervical vertebrae, initial encounter: Secondary | ICD-10-CM | POA: Diagnosis not present

## 2019-04-22 DIAGNOSIS — S13110A Subluxation of C0/C1 cervical vertebrae, initial encounter: Secondary | ICD-10-CM | POA: Diagnosis not present

## 2019-04-22 DIAGNOSIS — M62830 Muscle spasm of back: Secondary | ICD-10-CM | POA: Diagnosis not present

## 2019-04-22 DIAGNOSIS — S33140A Subluxation of L4/L5 lumbar vertebra, initial encounter: Secondary | ICD-10-CM | POA: Diagnosis not present

## 2019-04-22 DIAGNOSIS — S335XXD Sprain of ligaments of lumbar spine, subsequent encounter: Secondary | ICD-10-CM | POA: Diagnosis not present

## 2019-05-13 DIAGNOSIS — S13110A Subluxation of C0/C1 cervical vertebrae, initial encounter: Secondary | ICD-10-CM | POA: Diagnosis not present

## 2019-05-13 DIAGNOSIS — S335XXD Sprain of ligaments of lumbar spine, subsequent encounter: Secondary | ICD-10-CM | POA: Diagnosis not present

## 2019-05-13 DIAGNOSIS — M62830 Muscle spasm of back: Secondary | ICD-10-CM | POA: Diagnosis not present

## 2019-05-13 DIAGNOSIS — S33140A Subluxation of L4/L5 lumbar vertebra, initial encounter: Secondary | ICD-10-CM | POA: Diagnosis not present

## 2019-05-20 DIAGNOSIS — S335XXD Sprain of ligaments of lumbar spine, subsequent encounter: Secondary | ICD-10-CM | POA: Diagnosis not present

## 2019-05-20 DIAGNOSIS — M62830 Muscle spasm of back: Secondary | ICD-10-CM | POA: Diagnosis not present

## 2019-05-20 DIAGNOSIS — S33140A Subluxation of L4/L5 lumbar vertebra, initial encounter: Secondary | ICD-10-CM | POA: Diagnosis not present

## 2019-05-20 DIAGNOSIS — S13110A Subluxation of C0/C1 cervical vertebrae, initial encounter: Secondary | ICD-10-CM | POA: Diagnosis not present

## 2019-07-08 DIAGNOSIS — S335XXD Sprain of ligaments of lumbar spine, subsequent encounter: Secondary | ICD-10-CM | POA: Diagnosis not present

## 2019-07-08 DIAGNOSIS — S13110A Subluxation of C0/C1 cervical vertebrae, initial encounter: Secondary | ICD-10-CM | POA: Diagnosis not present

## 2019-07-08 DIAGNOSIS — S33140A Subluxation of L4/L5 lumbar vertebra, initial encounter: Secondary | ICD-10-CM | POA: Diagnosis not present

## 2019-07-08 DIAGNOSIS — M62830 Muscle spasm of back: Secondary | ICD-10-CM | POA: Diagnosis not present

## 2019-07-22 DIAGNOSIS — S335XXD Sprain of ligaments of lumbar spine, subsequent encounter: Secondary | ICD-10-CM | POA: Diagnosis not present

## 2019-07-22 DIAGNOSIS — S13110A Subluxation of C0/C1 cervical vertebrae, initial encounter: Secondary | ICD-10-CM | POA: Diagnosis not present

## 2019-07-22 DIAGNOSIS — S33140A Subluxation of L4/L5 lumbar vertebra, initial encounter: Secondary | ICD-10-CM | POA: Diagnosis not present

## 2019-07-22 DIAGNOSIS — M62830 Muscle spasm of back: Secondary | ICD-10-CM | POA: Diagnosis not present

## 2019-08-05 DIAGNOSIS — S335XXD Sprain of ligaments of lumbar spine, subsequent encounter: Secondary | ICD-10-CM | POA: Diagnosis not present

## 2019-08-05 DIAGNOSIS — S13110A Subluxation of C0/C1 cervical vertebrae, initial encounter: Secondary | ICD-10-CM | POA: Diagnosis not present

## 2019-08-05 DIAGNOSIS — S33140A Subluxation of L4/L5 lumbar vertebra, initial encounter: Secondary | ICD-10-CM | POA: Diagnosis not present

## 2019-08-05 DIAGNOSIS — M62830 Muscle spasm of back: Secondary | ICD-10-CM | POA: Diagnosis not present

## 2019-08-28 ENCOUNTER — Other Ambulatory Visit: Payer: Self-pay

## 2019-09-01 ENCOUNTER — Encounter: Payer: Self-pay | Admitting: Internal Medicine

## 2019-09-01 ENCOUNTER — Telehealth (INDEPENDENT_AMBULATORY_CARE_PROVIDER_SITE_OTHER): Payer: BC Managed Care – PPO | Admitting: Internal Medicine

## 2019-09-01 VITALS — Ht 71.5 in | Wt 198.0 lb

## 2019-09-01 DIAGNOSIS — G8929 Other chronic pain: Secondary | ICD-10-CM

## 2019-09-01 DIAGNOSIS — Z0000 Encounter for general adult medical examination without abnormal findings: Secondary | ICD-10-CM | POA: Diagnosis not present

## 2019-09-01 DIAGNOSIS — M542 Cervicalgia: Secondary | ICD-10-CM | POA: Insufficient documentation

## 2019-09-01 DIAGNOSIS — E785 Hyperlipidemia, unspecified: Secondary | ICD-10-CM

## 2019-09-01 DIAGNOSIS — M25561 Pain in right knee: Secondary | ICD-10-CM

## 2019-09-01 DIAGNOSIS — Z1389 Encounter for screening for other disorder: Secondary | ICD-10-CM

## 2019-09-01 DIAGNOSIS — E559 Vitamin D deficiency, unspecified: Secondary | ICD-10-CM | POA: Diagnosis not present

## 2019-09-01 DIAGNOSIS — R7303 Prediabetes: Secondary | ICD-10-CM | POA: Diagnosis not present

## 2019-09-01 DIAGNOSIS — Z1329 Encounter for screening for other suspected endocrine disorder: Secondary | ICD-10-CM

## 2019-09-01 NOTE — Progress Notes (Signed)
Virtual Visit via Video Note  I connected with Cory Blake  on 09/01/19 at  4:30 PM EDT by a video enabled telemedicine application and verified that I am speaking with the correct person using two identifiers.  Location patient: home Location provider:work or home office Persons participating in the virtual visit: patient, provider  I discussed the limitations of evaluation and management by telemedicine and the availability of in person appointments. The patient expressed understanding and agreed to proceed.   HPI: 1. HLD and prediabetes A1C 6.0 10/16/2018  2. Vit D def on vitamin D and feeling better  3. Chronic neck pain doing better since seeing Dr. 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 covid 12 had URI sx's mild had after going to [place] party in 2 or 06/2019 no sequelae declines covid vaccine   ROS: See pertinent positives and negatives per HPI.  Past Medical History:  Diagnosis Date  . Allergic rhinitis    cat  . Chronic neck pain   . COVID-19    2 or 06/2019   . Elevated liver enzymes     Past Surgical History:  Procedure Laterality Date  . ESOPHAGOGASTRODUODENOSCOPY (EGD) WITH PROPOFOL N/A 05/15/2018   Procedure: ESOPHAGOGASTRODUODENOSCOPY (EGD) WITH PROPOFOL;  Surgeon: Cory Manifold, MD;  Location: [facility] ENDOSCOPY;  Service: Endoscopy;  Laterality: N/A;  . GASTRIC FUNDOPLICATION  4656   for hiatal hernia  . WISDOM TOOTH EXTRACTION      Family History  Problem Relation Age of Onset  . Kidney failure Father        on HD  . Heart disease Father        had valve surgery  . Heart disease Sister        leaky valve s/p surgery congenital heart issues  . Dementia Maternal Grandmother   . Diabetes Paternal Grandmother   . Kidney disease  Paternal Grandmother     SOCIAL HX: married lives at home with wife    Current Outpatient Medications:  .  Ascorbic Acid (VITAMIN C PO), Take by mouth., Disp: , Rfl:  .  Cholecalciferol (VITAMIN D-3) 125 MCG (5000 UT) TABS, Take 5,000 Units by mouth daily., Disp: , Rfl:  .  Probiotic Product (PROBIOTIC PO), Take by mouth., Disp: , Rfl:  .  albuterol (VENTOLIN HFA) 108 (90 Base) MCG/ACT inhaler, Inhale 1-2 puffs into the lungs every 6 (six) hours as needed for wheezing or shortness of breath. (Patient not taking: Reported on 09/01/2019), Disp: 18 g, Rfl: 11  EXAM:  VITALS per patient if applicable:  GENERAL: alert, oriented, appears well and in no acute distress  HEENT: atraumatic, conjunttiva clear, no obvious abnormalities on inspection of external nose and ears  NECK: normal movements of the head and neck  LUNGS: on inspection no signs of respiratory distress, breathing rate appears normal, no obvious gross SOB, gasping or wheezing  CV: no obvious cyanosis  MS: moves all visible extremities without noticeable abnormality  PSYCH/NEURO: pleasant and cooperative, no obvious depression or anxiety, speech and thought processing grossly intact  ASSESSMENT AND PLAN:  Discussed the following assessment and plan:  Annual physical exam Denies vaccines against his religion (I.e flu, Tdap. covid)  MMR and hep B immune per pt  Denies smoking drugs occas etoh  Colonoscopy age 70 y.o and PSA pt wants to hold for now given pandemic disc could do colonoscopy 43-49 new guidelines but pt wants to wait until age 64 y.o PSA and colonoscopy  sch fasting labs7/9/21 here  rec healthy diet and exercise    Prediabetes Hyperlipidemia, unspecified hyperlipidemia type -rec healthy diet and exercise   Vitamin D deficiency  Chronic neck pain Chronic pain of right knee Improved since seeing Dr. Genia Blake in [place]   nex appt in person   -we discussed possible serious and likely  etiologies, options for evaluation and workup, limitations of telemedicine visit vs in person visit, treatment, treatment risks and precautions. Pt prefers to treat via telemedicine empirically rather then risking or undertaking an in person visit at this moment. Patient agrees to seek prompt in person care if worsening, new symptoms arise, or if is not improving with treatment.   I discussed the assessment and treatment plan with the patient. The patient was provided an opportunity to ask questions and all were answered. The patient agreed with the plan and demonstrated an understanding of the instructions.   The patient was advised to call back or seek an in-person evaluation if the symptoms worsen or if the condition fails to improve as anticipated.   Cory Glow McLean-Scocuzza, MD

## 2019-09-02 DIAGNOSIS — S33140A Subluxation of L4/L5 lumbar vertebra, initial encounter: Secondary | ICD-10-CM | POA: Diagnosis not present

## 2019-09-02 DIAGNOSIS — S13110A Subluxation of C0/C1 cervical vertebrae, initial encounter: Secondary | ICD-10-CM | POA: Diagnosis not present

## 2019-09-02 DIAGNOSIS — M62830 Muscle spasm of back: Secondary | ICD-10-CM | POA: Diagnosis not present

## 2019-09-02 DIAGNOSIS — S335XXD Sprain of ligaments of lumbar spine, subsequent encounter: Secondary | ICD-10-CM | POA: Diagnosis not present

## 2019-10-07 DIAGNOSIS — M62830 Muscle spasm of back: Secondary | ICD-10-CM | POA: Diagnosis not present

## 2019-10-07 DIAGNOSIS — S13110A Subluxation of C0/C1 cervical vertebrae, initial encounter: Secondary | ICD-10-CM | POA: Diagnosis not present

## 2019-10-07 DIAGNOSIS — S33140A Subluxation of L4/L5 lumbar vertebra, initial encounter: Secondary | ICD-10-CM | POA: Diagnosis not present

## 2019-10-07 DIAGNOSIS — S335XXD Sprain of ligaments of lumbar spine, subsequent encounter: Secondary | ICD-10-CM | POA: Diagnosis not present

## 2019-10-16 ENCOUNTER — Other Ambulatory Visit (INDEPENDENT_AMBULATORY_CARE_PROVIDER_SITE_OTHER): Payer: BC Managed Care – PPO

## 2019-10-16 ENCOUNTER — Other Ambulatory Visit: Payer: Self-pay

## 2019-10-16 DIAGNOSIS — E559 Vitamin D deficiency, unspecified: Secondary | ICD-10-CM

## 2019-10-16 DIAGNOSIS — E785 Hyperlipidemia, unspecified: Secondary | ICD-10-CM

## 2019-10-16 DIAGNOSIS — Z1329 Encounter for screening for other suspected endocrine disorder: Secondary | ICD-10-CM | POA: Diagnosis not present

## 2019-10-16 DIAGNOSIS — R7303 Prediabetes: Secondary | ICD-10-CM | POA: Diagnosis not present

## 2019-10-16 DIAGNOSIS — Z0000 Encounter for general adult medical examination without abnormal findings: Secondary | ICD-10-CM | POA: Diagnosis not present

## 2019-10-16 DIAGNOSIS — Z1389 Encounter for screening for other disorder: Secondary | ICD-10-CM | POA: Diagnosis not present

## 2019-10-16 LAB — LIPID PANEL
Cholesterol: 207 mg/dL — ABNORMAL HIGH (ref 0–200)
HDL: 36.1 mg/dL — ABNORMAL LOW (ref >39.00)
LDL Cholesterol: 145 mg/dL — ABNORMAL HIGH (ref 0–99)
NonHDL: 171.22
Total CHOL/HDL Ratio: 6
Triglycerides: 129 mg/dL (ref 0.0–149.0)
VLDL: 25.8 mg/dL (ref 0.0–40.0)

## 2019-10-16 LAB — COMPREHENSIVE METABOLIC PANEL
ALT: 15 U/L (ref 0–53)
AST: 17 U/L (ref 0–37)
Albumin: 4.2 g/dL (ref 3.5–5.2)
Alkaline Phosphatase: 68 U/L (ref 39–117)
BUN: 12 mg/dL (ref 6–23)
CO2: 30 mEq/L (ref 19–32)
Calcium: 9.4 mg/dL (ref 8.4–10.5)
Chloride: 105 mEq/L (ref 96–112)
Creatinine, Ser: 1.02 mg/dL (ref 0.40–1.50)
GFR: 77.46 mL/min (ref >60.00)
Glucose, Bld: 100 mg/dL — ABNORMAL HIGH (ref 70–99)
Potassium: 5 mEq/L (ref 3.5–5.1)
Sodium: 140 mEq/L (ref 135–145)
Total Bilirubin: 0.7 mg/dL (ref 0.2–1.2)
Total Protein: 6.9 g/dL (ref 6.0–8.3)

## 2019-10-16 LAB — CBC WITH DIFFERENTIAL/PLATELET
Basophils Absolute: 0.1 10*3/uL (ref 0.0–0.1)
Basophils Relative: 0.8 % (ref 0.0–3.0)
Eosinophils Absolute: 0.4 10*3/uL (ref 0.0–0.7)
Eosinophils Relative: 5 % (ref 0.0–5.0)
HCT: 47.6 % (ref 39.0–52.0)
Hemoglobin: 16.2 g/dL (ref 13.0–17.0)
Lymphocytes Relative: 27.5 % (ref 12.0–46.0)
Lymphs Abs: 2.2 10*3/uL (ref 0.7–4.0)
MCHC: 34.1 g/dL (ref 30.0–36.0)
MCV: 91.6 fl (ref 78.0–100.0)
Monocytes Absolute: 0.7 10*3/uL (ref 0.1–1.0)
Monocytes Relative: 8.1 % (ref 3.0–12.0)
Neutro Abs: 4.8 10*3/uL (ref 1.4–7.7)
Neutrophils Relative %: 58.6 % (ref 43.0–77.0)
Platelets: 288 10*3/uL (ref 150.0–400.0)
RBC: 5.19 Mil/uL (ref 4.22–5.81)
RDW: 13.3 % (ref 11.5–15.5)
WBC: 8.1 10*3/uL (ref 4.0–10.5)

## 2019-10-16 LAB — TSH: TSH: 1.25 u[IU]/mL (ref 0.35–4.50)

## 2019-10-16 LAB — HEMOGLOBIN A1C: Hgb A1c MFr Bld: 5.6 % (ref 4.6–6.5)

## 2019-10-16 LAB — VITAMIN D 25 HYDROXY (VIT D DEFICIENCY, FRACTURES): VITD: 48.13 ng/mL (ref 30.00–100.00)

## 2019-10-17 LAB — URINALYSIS, ROUTINE W REFLEX MICROSCOPIC
Bilirubin Urine: NEGATIVE
Glucose, UA: NEGATIVE
Hgb urine dipstick: NEGATIVE
Leukocytes,Ua: NEGATIVE
Nitrite: NEGATIVE
Protein, ur: NEGATIVE
Specific Gravity, Urine: 1.027 (ref 1.001–1.03)
pH: 5.5 (ref 5.0–8.0)

## 2019-11-04 DIAGNOSIS — S13110A Subluxation of C0/C1 cervical vertebrae, initial encounter: Secondary | ICD-10-CM | POA: Diagnosis not present

## 2019-11-04 DIAGNOSIS — S33140A Subluxation of L4/L5 lumbar vertebra, initial encounter: Secondary | ICD-10-CM | POA: Diagnosis not present

## 2019-11-04 DIAGNOSIS — M62830 Muscle spasm of back: Secondary | ICD-10-CM | POA: Diagnosis not present

## 2019-11-04 DIAGNOSIS — S335XXD Sprain of ligaments of lumbar spine, subsequent encounter: Secondary | ICD-10-CM | POA: Diagnosis not present

## 2019-11-30 ENCOUNTER — Telehealth: Payer: Self-pay | Admitting: Internal Medicine

## 2019-11-30 NOTE — Telephone Encounter (Signed)
Called and spoke with the Patient. States that his symptoms are mild and ongoing for the last week. Patient is having fever, chills, body aches, loss os smell, and everything tastes like salt.  No SOB, coughing, or chest pain/tightness. Informed the Patient that should his symptoms worsen he would need to be seen in urgent care/ED. Patient verbalized understanding   Patient scheduled for a virtual Wednesday with Dr Derrel Nip and will have his COVID test today. Patient stated he would like to wait to have is appointment around the time his test should come in.   For your information

## 2019-11-30 NOTE — Telephone Encounter (Signed)
Pt called and states that he has no smell and can only taste salt. Pt has fever and body aches for about a week now. Going to try to get a covid test today. Pt would like a call back to advise on what to do.

## 2019-12-01 NOTE — Telephone Encounter (Signed)
Patient called & informed to quarantine, He stated that he had been & worked from home. He also was unable to get tested until tomorrow morning before visit with Dr. Derrel Nip.

## 2019-12-01 NOTE — Telephone Encounter (Signed)
Patient should quarantine until his test results are known.  Please advise patient.

## 2019-12-02 ENCOUNTER — Other Ambulatory Visit: Payer: Self-pay

## 2019-12-02 ENCOUNTER — Telehealth (INDEPENDENT_AMBULATORY_CARE_PROVIDER_SITE_OTHER): Payer: BC Managed Care – PPO | Admitting: Internal Medicine

## 2019-12-02 ENCOUNTER — Encounter: Payer: Self-pay | Admitting: Internal Medicine

## 2019-12-02 ENCOUNTER — Other Ambulatory Visit: Payer: BC Managed Care – PPO

## 2019-12-02 DIAGNOSIS — Z20822 Contact with and (suspected) exposure to covid-19: Secondary | ICD-10-CM | POA: Diagnosis not present

## 2019-12-02 DIAGNOSIS — J4521 Mild intermittent asthma with (acute) exacerbation: Secondary | ICD-10-CM

## 2019-12-02 DIAGNOSIS — J45909 Unspecified asthma, uncomplicated: Secondary | ICD-10-CM | POA: Insufficient documentation

## 2019-12-02 MED ORDER — PREDNISONE 10 MG PO TABS: ORAL_TABLET | ORAL | 0 refills | Status: DC

## 2019-12-02 MED ORDER — CHERATUSSIN AC 100-10 MG/5ML PO SOLN: 5.0000 mL | Freq: Three times a day (TID) | ORAL | 0 refills | Status: DC | PRN

## 2019-12-02 MED ORDER — AZITHROMYCIN 500 MG PO TABS
500.0000 mg | ORAL_TABLET | Freq: Every day | ORAL | 0 refills | Status: DC
Start: 2019-12-02 — End: 2020-03-09

## 2019-12-02 NOTE — Assessment & Plan Note (Signed)
Test result from [facility] is pending  But multiple family members are infected.  Advised to continue quarantine for 14 days from onset of symptoms. Advised to seek immediate attention if 02 sats are < 90%  .  Prednisone taper, azithromycin and cheratussin sent to [facility] in [place]

## 2019-12-02 NOTE — Progress Notes (Signed)
Subjective:  Patient ID: Cory Blake, male    DOB: 1970-05-14  Age: 49 y.o. MRN: [id number]  CC: Diagnoses of Mild intermittent asthma with acute exacerbation and Suspected COVID-19 virus infection were pertinent to this visit.  HPI Cory Blake is a 49 yr old male with a history of asthma who presents for signs and symptoms of COVID 19 infection.  Symptoms started one week ago with body aches,sore throat.  Developed anosmia,  And for the past 48 hours has developed chest tightness and difficulty breathing.cough is nonproductive, hurts to cough.. never smoker. History of asthma for years.  Not wheezing. But Using albuterol inhaler every 8 hours, not much relief of cough or pain.   COVID TEST IS PENDING.  Done by Larence Penning.   multiple family members are sick: wife became sick, but has not tested.   Brother in Sports coach is an Tree surgeon and is positive. Several other family members as well   Pulse oximetry today was 91-92% pulse 100 this morning when he woke up. .  Currently 95%   He feels generally better than he did on  Monday.   Appetite returning.  Main complain is chest pain  Cough    Outpatient Medications Prior to Visit  Medication Sig Dispense Refill  . albuterol (VENTOLIN HFA) 108 (90 Base) MCG/ACT inhaler Inhale 1-2 puffs into the lungs every 6 (six) hours as needed for wheezing or shortness of breath. 18 g 11  . Ascorbic Acid (VITAMIN C PO) Take by mouth.    . Cholecalciferol (VITAMIN D-3) 125 MCG (5000 UT) TABS Take 5,000 Units by mouth daily.    . Probiotic Product (PROBIOTIC PO) Take by mouth.    . zinc gluconate 50 MG tablet Take 50 mg by mouth daily.     No facility-administered medications prior to visit.    Review of Systems;  Patient denies headache, fevers, malaise, unintentional weight loss, skin rash, eye pain, sinus congestion and sinus pain, sore throat, dysphagia,  hemoptysis , cough, dyspnea, wheezing, chest pain, palpitations, orthopnea, edema, abdominal pain, nausea, melena, diarrhea,  constipation, flank pain, dysuria, hematuria, urinary  Frequency, nocturia, numbness, tingling, seizures,  Focal weakness, Loss of consciousness,  Tremor, insomnia, depression, anxiety, and suicidal ideation.      Objective:  Ht 5' 11.5" (1.816 m)   Wt 187 lb (84.8 kg)   BMI 25.72 kg/m   BP Readings from Last 3 Encounters:  05/15/18 103/71  05/05/18 102/78    Wt Readings from Last 3 Encounters:  12/02/19 187 lb (84.8 kg)  09/01/19 198 lb (89.8 kg)  02/26/19 200 lb (90.7 kg)    General appearance: alert, cooperative and appears stated age Ears: normal TM's and external ear canals both ears Throat: lips, mucosa, and tongue normal; teeth and gums normal Neck: no adenopathy, no carotid bruit, supple, symmetrical, trachea midline and thyroid not enlarged, symmetric, no tenderness/mass/nodules Back: symmetric, no curvature. ROM normal. No CVA tenderness. Lungs: clear to auscultation bilaterally Heart: regular rate and rhythm, S1, S2 normal, no murmur, click, rub or gallop Abdomen: soft, non-tender; bowel sounds normal; no masses,  no organomegaly Pulses: 2+ and symmetric Skin: Skin color, texture, turgor normal. No rashes or lesions Lymph nodes: Cervical, supraclavicular, and axillary nodes normal.  Lab Results  Component Value Date   HGBA1C 5.6 10/16/2019   HGBA1C 6.0 (H) 10/16/2018    Lab Results  Component Value Date   CREATININE 1.02 10/16/2019   CREATININE 0.96 10/16/2018   CREATININE 0.84 05/05/2018  Lab Results  Component Value Date   WBC 8.1 10/16/2019   HGB 16.2 10/16/2019   HCT 47.6 10/16/2019   PLT 288.0 10/16/2019   GLUCOSE 100 (H) 10/16/2019   CHOL 207 (H) 10/16/2019   TRIG 129.0 10/16/2019   HDL 36.10 (L) 10/16/2019   LDLCALC 145 (H) 10/16/2019   ALT 15 10/16/2019   AST 17 10/16/2019   NA 140 10/16/2019   K 5.0 10/16/2019   CL 105 10/16/2019   CREATININE 1.02 10/16/2019   BUN 12 10/16/2019   CO2 30 10/16/2019   TSH 1.25 10/16/2019   HGBA1C  5.6 10/16/2019    US ABDOMEN LIMITED RUQ  Result Date: 06/10/2018 CLINICAL DATA:  49 year old with elevated serum liver function tests. EXAM: ULTRASOUND ABDOMEN LIMITED RIGHT UPPER QUADRANT COMPARISON:  None. FINDINGS: Gallbladder: No shadowing gallstones or echogenic sludge. No gallbladder wall thickening or pericholecystic fluid. Negative sonographic Murphy sign according to the ultrasound technologist. Common bile duct: Diameter: Approximately 4 mm. Liver: Normal size and echotexture without focal parenchymal abnormality. Portal vein is patent on color Doppler imaging with normal direction of blood flow towards the liver. IMPRESSION: Normal examination. Electronically Signed   By: Evangeline Dakin M.D.   On: 06/10/2018 10:30    Assessment & Plan:   Problem List Items Addressed This Visit      Unprioritized   Asthma   Relevant Medications   predniSONE (DELTASONE) 10 MG tablet   Suspected COVID-19 virus infection    Test result from [facility] is pending  But multiple family members are infected.  Advised to continue quarantine for 14 days from onset of symptoms. Advised to seek immediate attention if 02 sats are < 90%  .  Prednisone taper, azithromycin and cheratussin sent to [facility] in [place]          I am having Cory Blake start on predniSONE, azithromycin, and Cheratussin AC. I am also having him maintain his albuterol, Vitamin D-3, Ascorbic Acid (VITAMIN C PO), Probiotic Product (PROBIOTIC PO), and zinc gluconate.  Meds ordered this encounter  Medications  . predniSONE (DELTASONE) 10 MG tablet    Sig: 6 tablets on Day 1 , then reduce by 1 tablet daily until gone    Dispense:  21 tablet    Refill:  0  . azithromycin (ZITHROMAX) 500 MG tablet    Sig: Take 1 tablet (500 mg total) by mouth daily.    Dispense:  7 tablet    Refill:  0  . guaiFENesin-codeine (CHERATUSSIN AC) 100-10 MG/5ML syrup    Sig: Take 5 mLs by mouth 3 (three) times daily as needed for cough.    Dispense:  120 mL      Refill:  0    There are no discontinued medications.  Follow-up: No follow-ups on file.   Crecencio Mc, MD

## 2019-12-04 LAB — SARS-COV-2, NAA 2 DAY TAT

## 2019-12-04 LAB — NOVEL CORONAVIRUS, NAA: SARS-CoV-2, NAA: NOT DETECTED

## 2019-12-09 DIAGNOSIS — S335XXD Sprain of ligaments of lumbar spine, subsequent encounter: Secondary | ICD-10-CM | POA: Diagnosis not present

## 2019-12-09 DIAGNOSIS — S13110A Subluxation of C0/C1 cervical vertebrae, initial encounter: Secondary | ICD-10-CM | POA: Diagnosis not present

## 2019-12-09 DIAGNOSIS — S33140A Subluxation of L4/L5 lumbar vertebra, initial encounter: Secondary | ICD-10-CM | POA: Diagnosis not present

## 2019-12-09 DIAGNOSIS — M62830 Muscle spasm of back: Secondary | ICD-10-CM | POA: Diagnosis not present

## 2019-12-09 DIAGNOSIS — E291 Testicular hypofunction: Secondary | ICD-10-CM | POA: Diagnosis not present

## 2020-01-06 DIAGNOSIS — M62830 Muscle spasm of back: Secondary | ICD-10-CM | POA: Diagnosis not present

## 2020-01-06 DIAGNOSIS — S33140A Subluxation of L4/L5 lumbar vertebra, initial encounter: Secondary | ICD-10-CM | POA: Diagnosis not present

## 2020-01-06 DIAGNOSIS — S335XXD Sprain of ligaments of lumbar spine, subsequent encounter: Secondary | ICD-10-CM | POA: Diagnosis not present

## 2020-01-06 DIAGNOSIS — S13110A Subluxation of C0/C1 cervical vertebrae, initial encounter: Secondary | ICD-10-CM | POA: Diagnosis not present

## 2020-02-03 DIAGNOSIS — S13110A Subluxation of C0/C1 cervical vertebrae, initial encounter: Secondary | ICD-10-CM | POA: Diagnosis not present

## 2020-02-03 DIAGNOSIS — M62830 Muscle spasm of back: Secondary | ICD-10-CM | POA: Diagnosis not present

## 2020-02-03 DIAGNOSIS — S33140A Subluxation of L4/L5 lumbar vertebra, initial encounter: Secondary | ICD-10-CM | POA: Diagnosis not present

## 2020-02-03 DIAGNOSIS — S335XXD Sprain of ligaments of lumbar spine, subsequent encounter: Secondary | ICD-10-CM | POA: Diagnosis not present

## 2020-03-02 DIAGNOSIS — S13110A Subluxation of C0/C1 cervical vertebrae, initial encounter: Secondary | ICD-10-CM | POA: Diagnosis not present

## 2020-03-02 DIAGNOSIS — M62830 Muscle spasm of back: Secondary | ICD-10-CM | POA: Diagnosis not present

## 2020-03-02 DIAGNOSIS — S33140A Subluxation of L4/L5 lumbar vertebra, initial encounter: Secondary | ICD-10-CM | POA: Diagnosis not present

## 2020-03-02 DIAGNOSIS — S335XXD Sprain of ligaments of lumbar spine, subsequent encounter: Secondary | ICD-10-CM | POA: Diagnosis not present

## 2020-03-08 DIAGNOSIS — Z113 Encounter for screening for infections with a predominantly sexual mode of transmission: Secondary | ICD-10-CM | POA: Diagnosis not present

## 2020-03-08 DIAGNOSIS — E291 Testicular hypofunction: Secondary | ICD-10-CM | POA: Diagnosis not present

## 2020-03-09 ENCOUNTER — Encounter: Payer: Self-pay | Admitting: Internal Medicine

## 2020-03-09 ENCOUNTER — Ambulatory Visit (INDEPENDENT_AMBULATORY_CARE_PROVIDER_SITE_OTHER): Payer: BC Managed Care – PPO | Admitting: Internal Medicine

## 2020-03-09 ENCOUNTER — Other Ambulatory Visit: Payer: Self-pay

## 2020-03-09 VITALS — BP 118/86 | HR 81 | Temp 97.5°F | Ht 71.5 in | Wt 198.4 lb

## 2020-03-09 DIAGNOSIS — E785 Hyperlipidemia, unspecified: Secondary | ICD-10-CM

## 2020-03-09 DIAGNOSIS — Z1389 Encounter for screening for other disorder: Secondary | ICD-10-CM

## 2020-03-09 DIAGNOSIS — Z1283 Encounter for screening for malignant neoplasm of skin: Secondary | ICD-10-CM | POA: Diagnosis not present

## 2020-03-09 DIAGNOSIS — Z113 Encounter for screening for infections with a predominantly sexual mode of transmission: Secondary | ICD-10-CM

## 2020-03-09 DIAGNOSIS — Z0000 Encounter for general adult medical examination without abnormal findings: Secondary | ICD-10-CM

## 2020-03-09 DIAGNOSIS — D229 Melanocytic nevi, unspecified: Secondary | ICD-10-CM

## 2020-03-09 DIAGNOSIS — E559 Vitamin D deficiency, unspecified: Secondary | ICD-10-CM

## 2020-03-09 DIAGNOSIS — Z1329 Encounter for screening for other suspected endocrine disorder: Secondary | ICD-10-CM

## 2020-03-09 DIAGNOSIS — L301 Dyshidrosis [pompholyx]: Secondary | ICD-10-CM | POA: Diagnosis not present

## 2020-03-09 DIAGNOSIS — Z125 Encounter for screening for malignant neoplasm of prostate: Secondary | ICD-10-CM

## 2020-03-09 MED ORDER — CLOBETASOL PROPIONATE 0.05 % EX CREA: 1.0000 "application " | TOPICAL_CREAM | Freq: Two times a day (BID) | CUTANEOUS | 2 refills | Status: AC

## 2020-03-09 NOTE — Patient Instructions (Addendum)
CPT code for cost cologuard [phone] Call back if wanted    High Cholesterol  High cholesterol is a condition in which the blood has high levels of a white, waxy, fat-like substance (cholesterol). The human body needs small amounts of cholesterol. The liver makes all the cholesterol that the body needs. Extra (excess) cholesterol comes from the food that we eat. Cholesterol is carried from the liver by the blood through the blood vessels. If you have high cholesterol, deposits (plaques) may build up on the walls of your blood vessels (arteries). Plaques make the arteries narrower and stiffer. Cholesterol plaques increase your risk for heart attack and stroke. Work with your health care provider to keep your cholesterol levels in a healthy range. What increases the risk? This condition is more likely to develop in people who:  Eat foods that are high in animal fat (saturated fat) or cholesterol.  Are overweight.  Are not getting enough exercise.  Have a family history of high cholesterol. What are the signs or symptoms? There are no symptoms of this condition. How is this diagnosed? This condition may be diagnosed from the results of a blood test.  If you are older than age 35, your health care provider may check your cholesterol every 4-6 years.  You may be checked more often if you already have high cholesterol or other risk factors for heart disease. The blood test for cholesterol measures:  "Bad" cholesterol (LDL cholesterol). This is the main type of cholesterol that causes heart disease. The desired level for LDL is less than 100.  "Good" cholesterol (HDL cholesterol). This type helps to protect against heart disease by cleaning the arteries and carrying the LDL away. The desired level for HDL is 60 or higher.  Triglycerides. These are fats that the body can store or burn for energy. The desired number for triglycerides is lower than 150.  Total cholesterol. This is a measure of  the total amount of cholesterol in your blood, including LDL cholesterol, HDL cholesterol, and triglycerides. A healthy number is less than 200. How is this treated? This condition is treated with diet changes, lifestyle changes, and medicines. Diet changes  This may include eating more whole grains, fruits, vegetables, nuts, and fish.  This may also include cutting back on red meat and foods that have a lot of added sugar. Lifestyle changes  Changes may include getting at least 40 minutes of aerobic exercise 3 times a week. Aerobic exercises include walking, biking, and swimming. Aerobic exercise along with a healthy diet can help you maintain a healthy weight.  Changes may also include quitting smoking. Medicines  Medicines are usually given if diet and lifestyle changes have failed to reduce your cholesterol to healthy levels.  Your health care provider may prescribe a statin medicine. Statin medicines have been shown to reduce cholesterol, which can reduce the risk of heart disease. Follow these instructions at home: Eating and drinking If told by your health care provider:  Eat chicken (without skin), fish, veal, shellfish, ground [place] breast, and round or loin cuts of red meat.  Do not eat fried foods or fatty meats, such as hot dogs and salami.  Eat plenty of fruits, such as apples.  Eat plenty of vegetables, such as broccoli, potatoes, and carrots.  Eat beans, peas, and lentils.  Eat grains such as barley, rice, couscous, and bulgur wheat.  Eat pasta without cream sauces.  Use skim or nonfat milk, and eat low-fat or nonfat yogurt and cheeses.  Do not eat or drink whole milk, cream, ice cream, egg yolks, or hard cheeses.  Do not eat stick margarine or tub margarines that contain trans fats (also called partially hydrogenated oils).  Do not eat saturated tropical oils, such as coconut oil and palm oil.  Do not eat cakes, cookies, crackers, or other baked goods that  contain trans fats.  General instructions  Exercise as directed by your health care provider. Increase your activity level with activities such as gardening, walking, and taking the stairs.  Take over-the-counter and prescription medicines only as told by your health care provider.  Do not use any products that contain nicotine or tobacco, such as cigarettes and e-cigarettes. If you need help quitting, ask your health care provider.  Keep all follow-up visits as told by your health care provider. This is important. Contact a health care provider if:  You are struggling to maintain a healthy diet or weight.  You need help to start on an exercise program.  You need help to stop smoking. Get help right away if:  You have chest pain.  You have trouble breathing. This information is not intended to replace advice given to you by your health care provider. Make sure you discuss any questions you have with your health care provider. Document Revised: 03/29/2017 Document Reviewed: 09/24/2015 Elsevier Patient Education  [place].  Cholesterol Content in Foods Cholesterol is a waxy, fat-like substance that helps to carry fat in the blood. The body needs cholesterol in small amounts, but too much cholesterol can cause damage to the arteries and heart. Most people should eat less than 200 milligrams (mg) of cholesterol a day. Foods with cholesterol  Cholesterol is found in animal-based foods, such as meat, seafood, and dairy. Generally, low-fat dairy and lean meats have less cholesterol than full-fat dairy and fatty meats. The milligrams of cholesterol per serving (mg per serving) of common cholesterol-containing foods are listed below. Meat and other proteins  Egg -- one large whole egg has 186 mg.  Veal shank -- 4 oz has 141 mg.  Lean ground [place] (93% lean) -- 4 oz has 118 mg.  Fat-trimmed lamb loin -- 4 oz has 106 mg.  Lean ground beef (90% lean) -- 4 oz has 100  mg.  Lobster -- 3.5 oz has 90 mg.  Pork loin chops -- 4 oz has 86 mg.  Canned salmon -- 3.5 oz has 83 mg.  Fat-trimmed beef top loin -- 4 oz has 78 mg.  Frankfurter -- 1 frank (3.5 oz) has 77 mg.  Crab -- 3.5 oz has 71 mg.  Roasted chicken without skin, white meat -- 4 oz has 66 mg.  Light bologna -- 2 oz has 45 mg.  Deli-cut [place] -- 2 oz has 31 mg.  Canned tuna -- 3.5 oz has 31 mg.  Berniece Salines -- 1 oz has 29 mg.  Oysters and mussels (raw) -- 3.5 oz has 25 mg.  Mackerel -- 1 oz has 22 mg.  Trout -- 1 oz has 20 mg.  Pork sausage -- 1 link (1 oz) has 17 mg.  Salmon -- 1 oz has 16 mg.  Tilapia -- 1 oz has 14 mg. Dairy  Soft-serve ice cream --  cup (4 oz) has 103 mg.  Whole-milk yogurt -- 1 cup (8 oz) has 29 mg.  Cheddar cheese -- 1 oz has 28 mg.  American cheese -- 1 oz has 28 mg.  Whole milk -- 1 cup (8 oz) has  23 mg.  2% milk -- 1 cup (8 oz) has 18 mg.  Cream cheese -- 1 tablespoon (Tbsp) has 15 mg.  Cottage cheese --  cup (4 oz) has 14 mg.  Low-fat (1%) milk -- 1 cup (8 oz) has 10 mg.  Sour cream -- 1 Tbsp has 8.5 mg.  Low-fat yogurt -- 1 cup (8 oz) has 8 mg.  Nonfat Greek yogurt -- 1 cup (8 oz) has 7 mg.  Half-and-half cream -- 1 Tbsp has 5 mg. Fats and oils  Cod liver oil -- 1 tablespoon (Tbsp) has 82 mg.  Butter -- 1 Tbsp has 15 mg.  Lard -- 1 Tbsp has 14 mg.  Bacon grease -- 1 Tbsp has 14 mg.  Mayonnaise -- 1 Tbsp has 5-10 mg.  Margarine -- 1 Tbsp has 3-10 mg. Exact amounts of cholesterol in these foods may vary depending on specific ingredients and brands. Foods without cholesterol Most plant-based foods do not have cholesterol unless you combine them with a food that has cholesterol. Foods without cholesterol include:  Grains and cereals.  Vegetables.  Fruits.  Vegetable oils, such as olive, canola, and sunflower oil.  Legumes, such as peas, beans, and lentils.  Nuts and seeds.  Egg whites. Summary  The body needs  cholesterol in small amounts, but too much cholesterol can cause damage to the arteries and heart.  Most people should eat less than 200 milligrams (mg) of cholesterol a day. This information is not intended to replace advice given to you by your health care provider. Make sure you discuss any questions you have with your health care provider. Document Revised: 03/08/2017 Document Reviewed: 11/20/2016 Elsevier Patient Education  [place] Eczema Dyshidrotic eczema (pompholyx) is a type of eczema that causes very itchy (pruritic), fluid-filled blisters (vesicles) to form on the hands and feet. It can affect people of any age, but is more common before the age of 90. There is no cure, but treatment and certain lifestyle changes can help relieve symptoms. What are the causes? The cause of this condition is not known. What increases the risk? You are more likely to develop this condition if:  You wash your hands frequently.  You have a personal history or family history of eczema, allergies, asthma, or hay fever.  You are allergic to metals such as nickel or cobalt.  You work with cement.  You smoke. What are the signs or symptoms? Symptoms of this condition may affect the hands, feet, or both. Symptoms may come and go (recur), and may include:  Severe itching, which may happen before blisters appear.  Blisters. These may form suddenly. ? In the early stages, blisters may form near the fingertips. ? In severe cases, blisters may grow to large blister masses (bullae). ? Blisters resolve in 2-3 weeks without bursting. This is followed by a dry phase in which itching eases.  Pain and swelling.  Cracks or long, narrow openings (fissures) in the skin.  Severe dryness.  Ridges on the nails. How is this diagnosed? This condition may be diagnosed based on:  A physical exam.  Your symptoms.  Your medical history.  Skin scrapings to rule out a fungal  infection.  Testing a swab of fluid for bacteria (culture).  Removing and checking a small piece of skin (biopsy) in order to test for infection or to rule out other conditions.  Skin patch tests. These tests involve taking patches that contain possible allergens and placing them on  your back. Your health care provider will wait a few days and then check to see if an allergic reaction occurred. These tests may be done if your health care provider suspects allergic reactions, or to rule out other types of eczema. You may be referred to a health care provider who specializes in the skin (dermatologist) to help diagnose and treat this condition. How is this treated? There is no cure for this condition, but treatment can help relieve symptoms. Depending on how many blisters you have and how severe they are, your health care provider may suggest:  Avoiding allergens, irritants, or triggers that worsen symptoms. This may involve lifestyle changes such as: ? Using different lotions or soaps. ? Avoiding hot weather or places that will cause you to sweat a lot. ? Managing stress with coping techniques such as relaxation and exercise, and asking for help when you need it. ? Diet changes as recommended by your health care provider.  Using a clean, damp towel (cool compress) to relieve symptoms.  Soaking in a bath that contains a type of salt that relieves irritation (aluminum acetate soaks).  Medicine taken by mouth to reduce itching (oral antihistamines).  Medicine applied to the skin to reduce swelling and irritation (topical corticosteroids).  Medicine that reduces the activity of the body's disease-fighting system (immunosuppressants) to treat inflammation. This may be given in severe cases.  Antibiotic medicines to treat bacterial infection.  Light therapy (phototherapy). This involves shining ultraviolet (UV) light on affected skin in order to reduce itchiness and inflammation. Follow these  instructions at home: Bathing and skin care   Wash skin gently. After bathing or washing your hands, pat your skin dry. Avoid rubbing your skin.  Remove all jewelry before bathing. If the skin under the jewelry stays wet, blisters may form or get worse.  Apply cool compresses as told by your health care provider: ? Soak a clean towel in cool water. ? Wring out excess water until towel is damp. ? Place the towel over affected skin. Leave the towel on for 20 minutes at a time, 2-3 times a day.  Use mild soaps, cleansers, and lotions that do not contain dyes, perfumes, or other irritants.  Keep your skin hydrated. To do this: ? Avoid very hot water. Take lukewarm baths or showers. ? Apply moisturizer within three minutes of bathing. This locks in moisture. Medicines  Take and apply over-the-counter and prescription medicines only as told by your health care provider.  If you were prescribed antibiotic medicine, take or apply it as told by your health care provider. Do not stop using the antibiotic even if you start to feel better. General instructions  Identify and avoid triggers and allergens.  Keep fingernails short to avoid breaking open the skin while scratching.  Use waterproof gloves to protect your hands when doing work that keeps your hands wet for a long time.  Wear socks to keep your feet dry.  Do not use any products that contain nicotine or tobacco, such as cigarettes and e-cigarettes. If you need help quitting, ask your health care provider.  Keep all follow-up visits as told by your health care provider. This is important. Contact a health care provider if:  You have symptoms that do not go away.  You have signs of infection, such as: ? Crusting, pus, or a bad smell. ? More redness, swelling, or pain. ? Increased warmth in the affected area. Summary  Dyshidrotic eczema (pompholyx) is a type of eczema that  causes very itchy (pruritic), fluid-filled blisters  (vesicles) to form on the hands and feet.  The cause of this condition is not known.  There is no cure for this condition, but treatment can help relieve symptoms. Treatment depends on how many blisters you have and how severe they are.  Use mild soaps, cleansers, and lotions that do not contain dyes, perfumes, or other irritants. Keep your skin hydrated. This information is not intended to replace advice given to you by your health care provider. Make sure you discuss any questions you have with your health care provider. Document Revised: 07/16/2018 Document Reviewed: 08/09/2016 Elsevier Patient Education  [place].

## 2020-03-09 NOTE — Progress Notes (Signed)
Chief Complaint  Patient presents with  . Follow-up   F/u  1. HLD trending down will recheck labs 7/22  2. S/p vasectomy and seeing [facility] for reversal he has 3 kids wife has 1 kid and they want a kid together  3. Mole to left chest ? If changing referred to dermatology    Review of Systems  Constitutional: Negative for weight loss.  HENT: Negative for hearing loss.   Eyes: Negative for blurred vision.  Respiratory: Negative for shortness of breath.   Cardiovascular: Negative for chest pain.  Gastrointestinal: Negative for abdominal pain.  Genitourinary: Negative for frequency and urgency.  Musculoskeletal: Negative for falls and joint pain.  Skin: Negative for rash.  Neurological: Negative for headaches.  Psychiatric/Behavioral: Negative for depression.   Past Medical History:  Diagnosis Date  . Allergic rhinitis    cat  . Chronic neck pain   . COVID-19    2 or 06/2019   . Elevated liver enzymes   . Vitamin D deficiency    Past Surgical History:  Procedure Laterality Date  . ESOPHAGOGASTRODUODENOSCOPY (EGD) WITH PROPOFOL N/A 05/15/2018   Procedure: ESOPHAGOGASTRODUODENOSCOPY (EGD) WITH PROPOFOL;  Surgeon: [name], MD;  Location: [facility] ENDOSCOPY;  Service: Endoscopy;  Laterality: N/A;  . GASTRIC FUNDOPLICATION  9983   for hiatal hernia  . WISDOM TOOTH EXTRACTION     Family History  Problem Relation Age of Onset  . Kidney failure Father        on HD  . Heart disease Father        had valve surgery  . Heart disease Sister        leaky valve s/p surgery congenital heart issues  . Dementia Maternal Grandmother   . Diabetes Paternal Grandmother   . Kidney disease Paternal Grandmother    Social History   Socioeconomic History  . Marital status: Married    Spouse name: Not on file  . Number of children: Not on file  . Years of education: Not on file  . Highest education level: Not on file  Occupational History  . Not on file  Tobacco Use    . Smoking status: Never Smoker  . Smokeless tobacco: Never Used  Vaping Use  . Vaping Use: Never used  Substance and Sexual Activity  . Alcohol use: Yes    Comment: minimal  . Drug use: Never  . Sexual activity: Not on file  Other Topics Concern  . 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 Difficulty of Paying Living Expenses: Not on file  Food Insecurity:   . Worried About Charity fundraiser in the Last Year: Not on file  . Ran Out of Food in the Last Year: Not on file  Transportation Needs:   . Lack of Transportation (Medical): Not on file  . Lack of Transportation (Non-Medical): Not on file  Physical Activity:   . Days of Exercise per Week: Not on file  . Minutes of Exercise per Session: Not on file  Stress:   .  Feeling of Stress : Not on file  Social Connections:   . Frequency of Communication with Friends and Family: Not on file  . Frequency of Social Gatherings with Friends and Family: Not on file  . Attends Religious Services: Not on file  . Active Member of Clubs or Organizations: Not on file  . Attends Archivist Meetings: Not on file  . Marital Status: Not on file  Intimate Partner Violence:   . Fear of Current or Ex-Partner: Not on file  . Emotionally Abused: Not on file  . Physically Abused: Not on file  . Sexually Abused: Not on file   Current Meds  Medication Sig  . albuterol (VENTOLIN HFA) 108 (90 Base) MCG/ACT inhaler Inhale 1-2 puffs into the lungs every 6 (six) hours as needed for wheezing or shortness of breath.  . Ascorbic Acid (VITAMIN C PO) Take by mouth.  . Cholecalciferol (VITAMIN D-3) 125 MCG (5000 UT) TABS Take 5,000 Units by  mouth daily.  . Probiotic Product (PROBIOTIC PO) Take by mouth.  . zinc gluconate 50 MG tablet Take 50 mg by mouth daily.   Allergies  Allergen Reactions  . Peanut-Containing Drug Products Swelling and Rash    Swelling in the hands.   . Sulfa Antibiotics Swelling and Rash   No results found for this or any previous visit (from the past 2160 hour(s)). Objective  Body mass index is 27.29 kg/m. Wt Readings from Last 3 Encounters:  03/09/20 198 lb 6.4 oz (90 kg)  12/02/19 187 lb (84.8 kg)  09/01/19 198 lb (89.8 kg)   Temp Readings from Last 3 Encounters:  03/09/20 (!) 97.5 F (36.4 C) (Oral)  05/15/18 (!) 97 F (36.1 C) (Tympanic)  05/05/18 97.8 F (36.6 C) (Oral)   BP Readings from Last 3 Encounters:  03/09/20 118/86  05/15/18 103/71  05/05/18 102/78   Pulse Readings from Last 3 Encounters:  03/09/20 81  05/15/18 81  05/05/18 99    Physical Exam Vitals and nursing note reviewed.  Constitutional:      Appearance: Normal appearance. He is well-developed, well-groomed and overweight.  HENT:     Head: Normocephalic and atraumatic.  Eyes:     Conjunctiva/sclera: Conjunctivae normal.     Pupils: Pupils are equal, round, and reactive to light.  Cardiovascular:     Rate and Rhythm: Normal rate and regular rhythm.     Heart sounds: Normal heart sounds. No murmur heard.   Pulmonary:     Effort: Pulmonary effort is normal.     Breath sounds: Normal breath sounds.  Skin:    General: Skin is warm and dry.       Neurological:     General: No focal deficit present.     Mental Status: He is alert and oriented to person, place, and time. Mental status is at baseline.     Gait: Gait normal.  Psychiatric:        Attention and Perception: Attention and perception normal.        Mood and Affect: Mood and affect normal.        Speech: Speech normal.        Behavior: Behavior normal. Behavior is cooperative.        Thought Content: Thought content normal.        Cognition  and Memory: Cognition and memory normal.        Judgment: Judgment normal.     Assessment  Plan  Hyperlipidemia, unspecified hyperlipidemia type - Plan:  Lipid panel Info given   Dyshidrotic hand dermatitis - Plan: clobetasol cream (TEMOVATE) 0.05 %bid prn   Nevus - Plan: Ambulatory referral to Dermatology [facility] Skin cancer screening - Plan: Ambulatory referral to Dermatology  HM Denies vaccines against his religion (I.e flu, Tdap. covid)  MMR and hep B immune per pt  Denies smoking drugs occas etoh  Colonoscopy age 48 y.o (declines colonoscopy wants cologuard reviewed today 03/09/20)  PSAordered  sch fasting labs7/9/22 here  rec healthy diet and exercise     Provider: Dr. Olivia Mackie McLean-Scoc[facility] Medicine

## 2020-03-31 DIAGNOSIS — M62830 Muscle spasm of back: Secondary | ICD-10-CM | POA: Diagnosis not present

## 2020-03-31 DIAGNOSIS — S33140A Subluxation of L4/L5 lumbar vertebra, initial encounter: Secondary | ICD-10-CM | POA: Diagnosis not present

## 2020-03-31 DIAGNOSIS — S335XXD Sprain of ligaments of lumbar spine, subsequent encounter: Secondary | ICD-10-CM | POA: Diagnosis not present

## 2020-03-31 DIAGNOSIS — S13110A Subluxation of C0/C1 cervical vertebrae, initial encounter: Secondary | ICD-10-CM | POA: Diagnosis not present

## 2020-04-27 DIAGNOSIS — S13110A Subluxation of C0/C1 cervical vertebrae, initial encounter: Secondary | ICD-10-CM | POA: Diagnosis not present

## 2020-04-27 DIAGNOSIS — S33140A Subluxation of L4/L5 lumbar vertebra, initial encounter: Secondary | ICD-10-CM | POA: Diagnosis not present

## 2020-04-27 DIAGNOSIS — M62830 Muscle spasm of back: Secondary | ICD-10-CM | POA: Diagnosis not present

## 2020-04-27 DIAGNOSIS — S335XXD Sprain of ligaments of lumbar spine, subsequent encounter: Secondary | ICD-10-CM | POA: Diagnosis not present

## 2020-05-25 DIAGNOSIS — S33140A Subluxation of L4/L5 lumbar vertebra, initial encounter: Secondary | ICD-10-CM | POA: Diagnosis not present

## 2020-05-25 DIAGNOSIS — S13110A Subluxation of C0/C1 cervical vertebrae, initial encounter: Secondary | ICD-10-CM | POA: Diagnosis not present

## 2020-05-25 DIAGNOSIS — S335XXD Sprain of ligaments of lumbar spine, subsequent encounter: Secondary | ICD-10-CM | POA: Diagnosis not present

## 2020-05-25 DIAGNOSIS — M62830 Muscle spasm of back: Secondary | ICD-10-CM | POA: Diagnosis not present

## 2020-06-22 DIAGNOSIS — S13110A Subluxation of C0/C1 cervical vertebrae, initial encounter: Secondary | ICD-10-CM | POA: Diagnosis not present

## 2020-06-22 DIAGNOSIS — S33140A Subluxation of L4/L5 lumbar vertebra, initial encounter: Secondary | ICD-10-CM | POA: Diagnosis not present

## 2020-06-22 DIAGNOSIS — M62830 Muscle spasm of back: Secondary | ICD-10-CM | POA: Diagnosis not present

## 2020-06-22 DIAGNOSIS — S335XXD Sprain of ligaments of lumbar spine, subsequent encounter: Secondary | ICD-10-CM | POA: Diagnosis not present

## 2020-07-14 ENCOUNTER — Telehealth: Payer: Self-pay | Admitting: Internal Medicine

## 2020-07-14 NOTE — Telephone Encounter (Signed)
"  Rejection Reason - Patient did not respond - WE LM FOR PT ON 03/10/21, PT HAS NEVER RESPONDED" Cory Blake said on Jul 13, 2020 9:55 AM  [place] [facility]

## 2020-08-17 DIAGNOSIS — S13110A Subluxation of C0/C1 cervical vertebrae, initial encounter: Secondary | ICD-10-CM | POA: Diagnosis not present

## 2020-08-17 DIAGNOSIS — S33140A Subluxation of L4/L5 lumbar vertebra, initial encounter: Secondary | ICD-10-CM | POA: Diagnosis not present

## 2020-08-17 DIAGNOSIS — S335XXD Sprain of ligaments of lumbar spine, subsequent encounter: Secondary | ICD-10-CM | POA: Diagnosis not present

## 2020-08-17 DIAGNOSIS — M62830 Muscle spasm of back: Secondary | ICD-10-CM | POA: Diagnosis not present

## 2020-08-25 DIAGNOSIS — Z3184 Encounter for fertility preservation procedure: Secondary | ICD-10-CM | POA: Diagnosis not present

## 2020-10-13 ENCOUNTER — Other Ambulatory Visit: Payer: BC Managed Care – PPO

## 2020-10-25 ENCOUNTER — Ambulatory Visit: Payer: BC Managed Care – PPO | Admitting: Internal Medicine

## 2020-11-02 DIAGNOSIS — S13110A Subluxation of C0/C1 cervical vertebrae, initial encounter: Secondary | ICD-10-CM | POA: Diagnosis not present

## 2020-11-02 DIAGNOSIS — M62830 Muscle spasm of back: Secondary | ICD-10-CM | POA: Diagnosis not present

## 2020-11-02 DIAGNOSIS — S335XXD Sprain of ligaments of lumbar spine, subsequent encounter: Secondary | ICD-10-CM | POA: Diagnosis not present

## 2020-11-02 DIAGNOSIS — S33140A Subluxation of L4/L5 lumbar vertebra, initial encounter: Secondary | ICD-10-CM | POA: Diagnosis not present

## 2020-11-23 DIAGNOSIS — S33140A Subluxation of L4/L5 lumbar vertebra, initial encounter: Secondary | ICD-10-CM | POA: Diagnosis not present

## 2020-11-23 DIAGNOSIS — S335XXD Sprain of ligaments of lumbar spine, subsequent encounter: Secondary | ICD-10-CM | POA: Diagnosis not present

## 2020-11-23 DIAGNOSIS — M62830 Muscle spasm of back: Secondary | ICD-10-CM | POA: Diagnosis not present

## 2020-11-23 DIAGNOSIS — S13110A Subluxation of C0/C1 cervical vertebrae, initial encounter: Secondary | ICD-10-CM | POA: Diagnosis not present

## 2021-01-29 IMAGING — US US ABDOMEN LIMITED
1 series · 14 of 25 positions shown · non-contrast
Comparison: None.

CLINICAL DATA: 48-year-old with elevated serum liver function
tests.

EXAM:
ULTRASOUND ABDOMEN LIMITED RIGHT UPPER QUADRANT

[Series 1: us abdomen limited · 0.19mm/px · 14 of 34 slices shown]
[im 1/34]
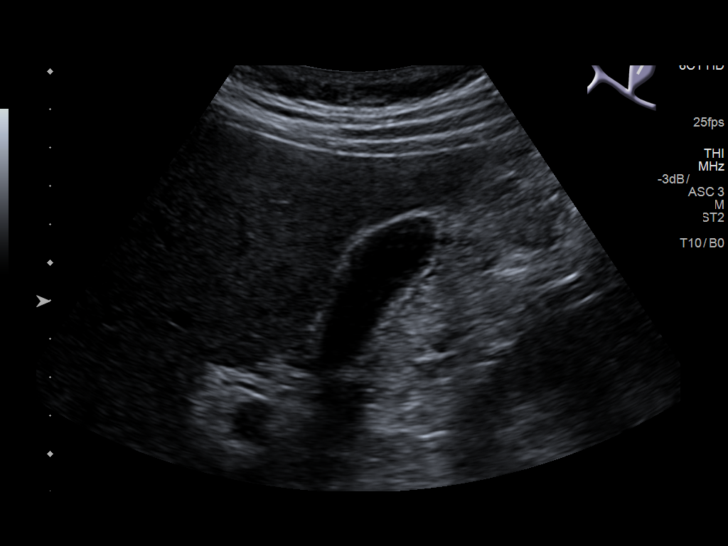
[im 3/34]
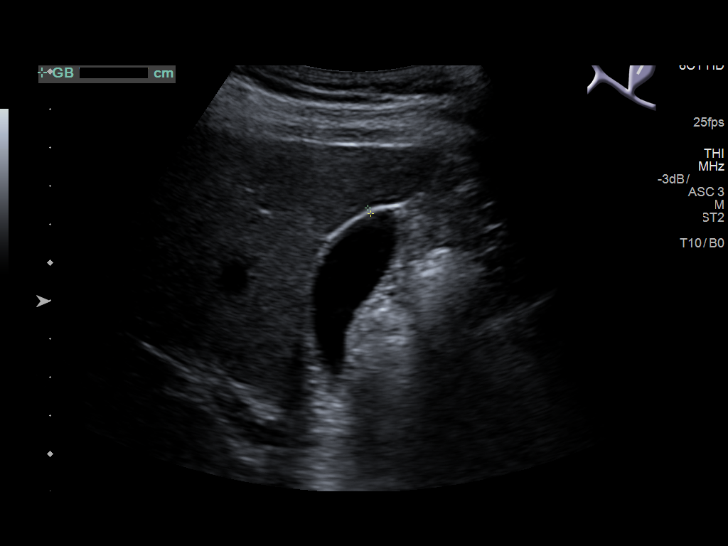
[im 6/34]
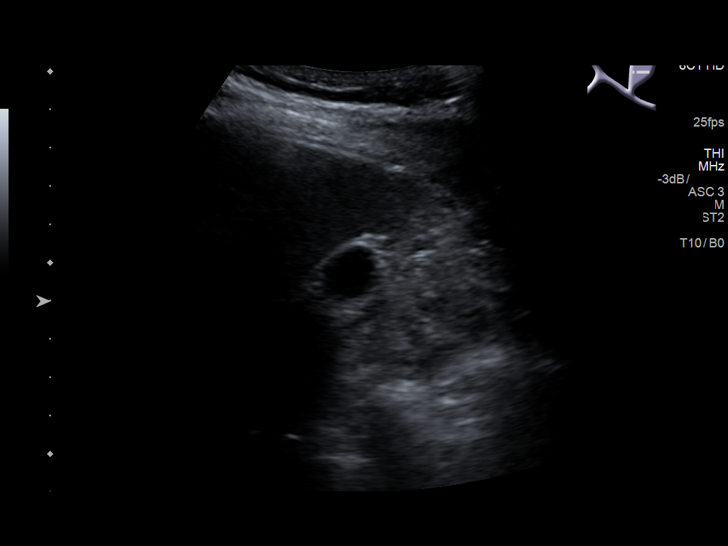
[im 9/34]
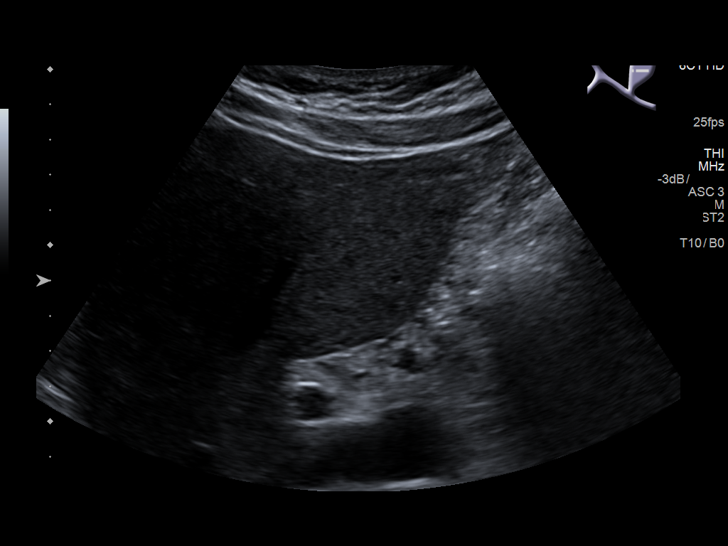
[im 12/34]
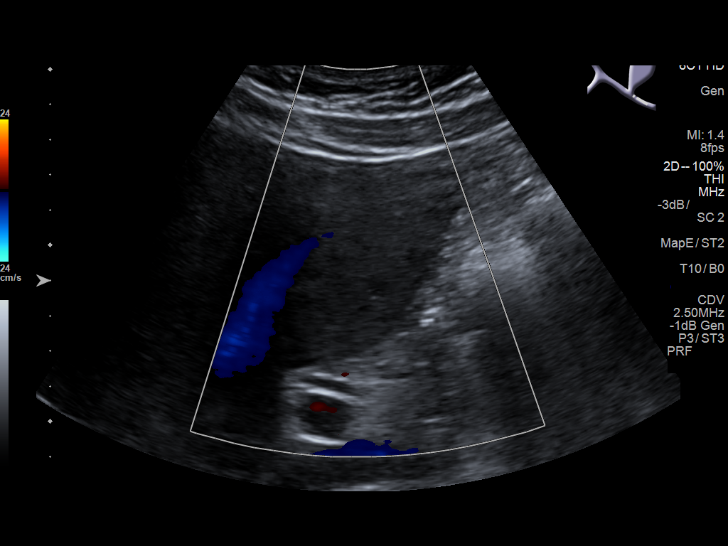
[im 13/34]
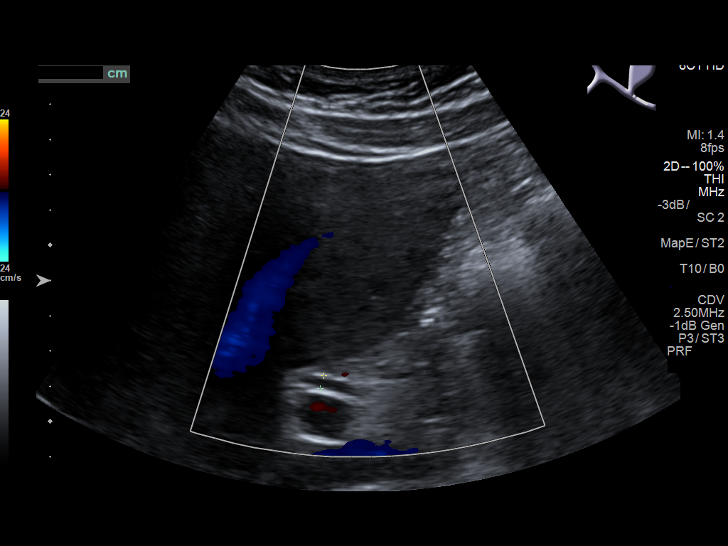
[im 16/34]
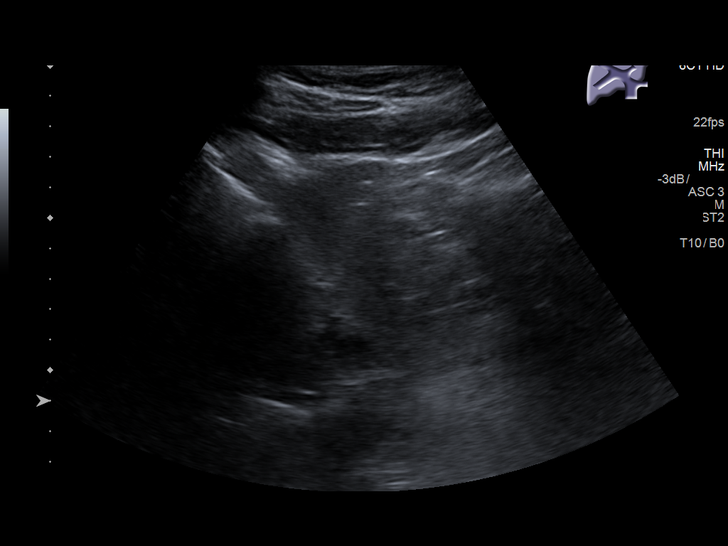
[im 18/34]
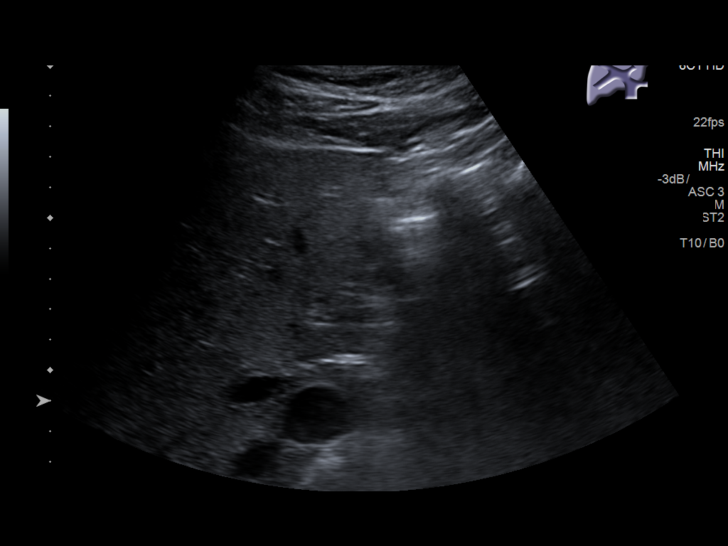
[im 21/34]
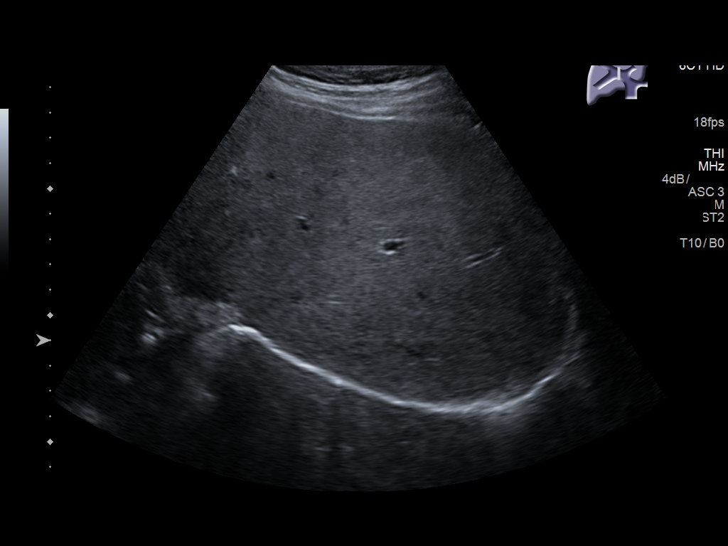
[im 23/34]
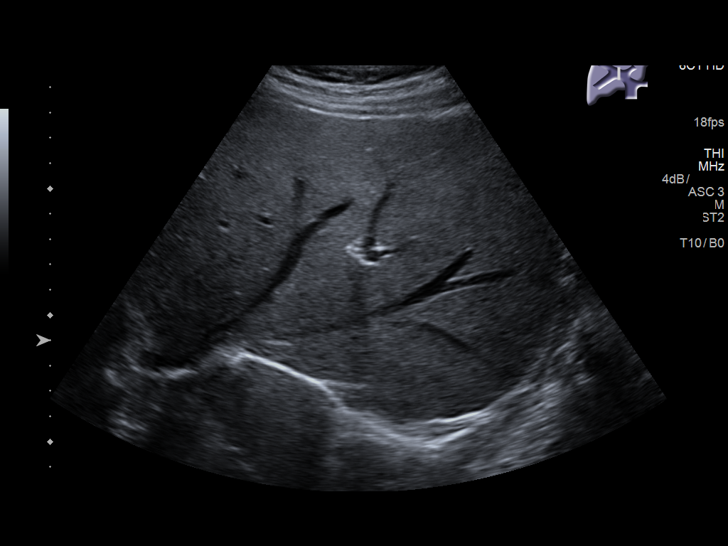
[im 25/34]
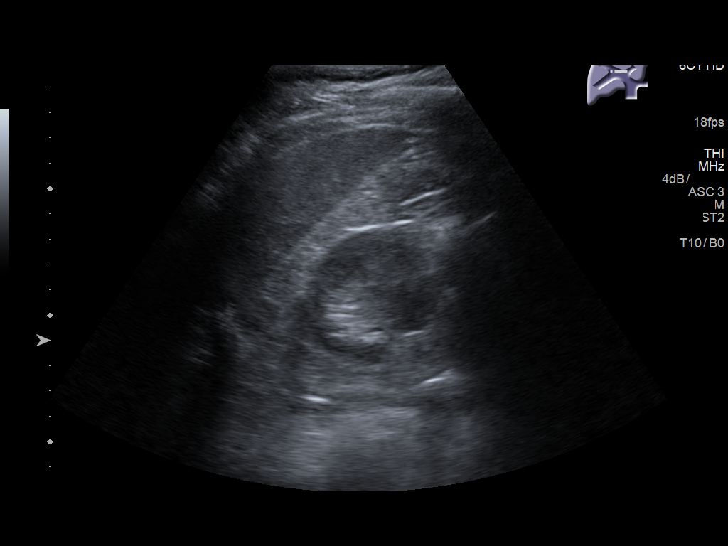
[im 28/34]
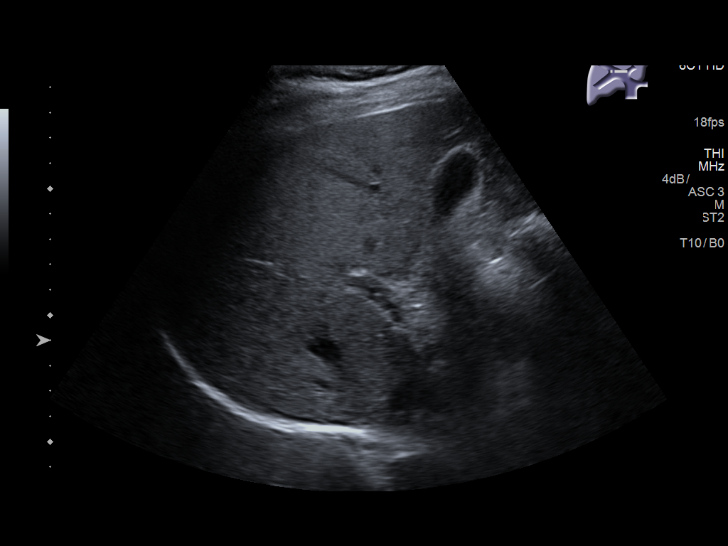
[im 31/34]
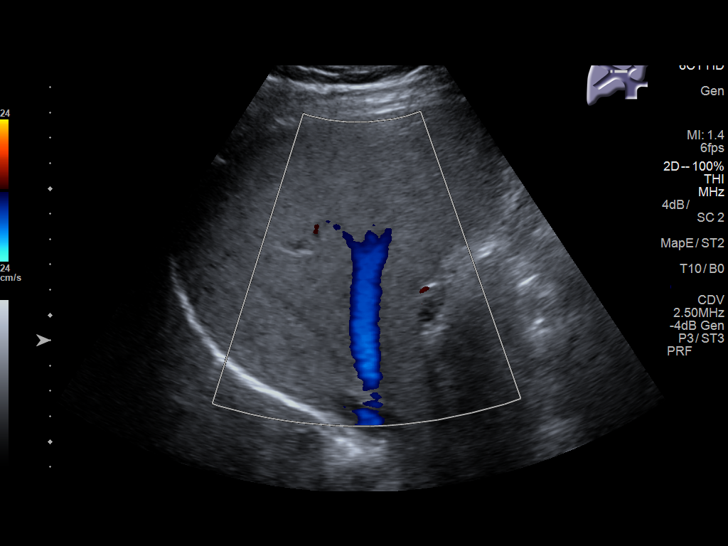
[im 34/34]
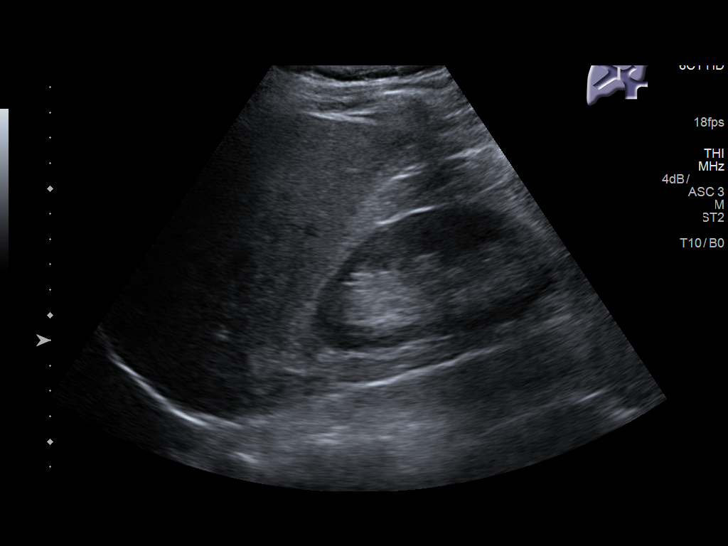

[14 of 25 positions shown; findings below may reference images not displayed]

FINDINGS: Gallbladder:

No shadowing gallstones or echogenic sludge. No gallbladder wall
thickening or pericholecystic fluid. Negative sonographic Murphy
sign according to the ultrasound technologist.

Common bile duct:

Diameter: Approximately 4 mm.

Liver:

Normal size and echotexture without focal parenchymal abnormality.
Portal vein is patent on color Doppler imaging with normal direction
of blood flow towards the liver.
IMPRESSION: Normal examination.

## 2021-03-08 DIAGNOSIS — M62830 Muscle spasm of back: Secondary | ICD-10-CM | POA: Diagnosis not present

## 2021-03-08 DIAGNOSIS — S335XXD Sprain of ligaments of lumbar spine, subsequent encounter: Secondary | ICD-10-CM | POA: Diagnosis not present

## 2021-03-08 DIAGNOSIS — S13110A Subluxation of C0/C1 cervical vertebrae, initial encounter: Secondary | ICD-10-CM | POA: Diagnosis not present

## 2021-03-08 DIAGNOSIS — S33140A Subluxation of L4/L5 lumbar vertebra, initial encounter: Secondary | ICD-10-CM | POA: Diagnosis not present

## 2021-03-29 DIAGNOSIS — S33140A Subluxation of L4/L5 lumbar vertebra, initial encounter: Secondary | ICD-10-CM | POA: Diagnosis not present

## 2021-03-29 DIAGNOSIS — M62830 Muscle spasm of back: Secondary | ICD-10-CM | POA: Diagnosis not present

## 2021-03-29 DIAGNOSIS — S335XXD Sprain of ligaments of lumbar spine, subsequent encounter: Secondary | ICD-10-CM | POA: Diagnosis not present

## 2021-03-29 DIAGNOSIS — S13110A Subluxation of C0/C1 cervical vertebrae, initial encounter: Secondary | ICD-10-CM | POA: Diagnosis not present

## 2021-04-26 DIAGNOSIS — S13110A Subluxation of C0/C1 cervical vertebrae, initial encounter: Secondary | ICD-10-CM | POA: Diagnosis not present

## 2021-04-26 DIAGNOSIS — S335XXD Sprain of ligaments of lumbar spine, subsequent encounter: Secondary | ICD-10-CM | POA: Diagnosis not present

## 2021-04-26 DIAGNOSIS — M62830 Muscle spasm of back: Secondary | ICD-10-CM | POA: Diagnosis not present

## 2021-04-26 DIAGNOSIS — S33140A Subluxation of L4/L5 lumbar vertebra, initial encounter: Secondary | ICD-10-CM | POA: Diagnosis not present

## 2021-05-24 DIAGNOSIS — S335XXD Sprain of ligaments of lumbar spine, subsequent encounter: Secondary | ICD-10-CM | POA: Diagnosis not present

## 2021-05-24 DIAGNOSIS — S33140A Subluxation of L4/L5 lumbar vertebra, initial encounter: Secondary | ICD-10-CM | POA: Diagnosis not present

## 2021-05-24 DIAGNOSIS — M62830 Muscle spasm of back: Secondary | ICD-10-CM | POA: Diagnosis not present

## 2021-05-24 DIAGNOSIS — S13110A Subluxation of C0/C1 cervical vertebrae, initial encounter: Secondary | ICD-10-CM | POA: Diagnosis not present

## 2021-06-21 DIAGNOSIS — S335XXD Sprain of ligaments of lumbar spine, subsequent encounter: Secondary | ICD-10-CM | POA: Diagnosis not present

## 2021-06-21 DIAGNOSIS — S13110A Subluxation of C0/C1 cervical vertebrae, initial encounter: Secondary | ICD-10-CM | POA: Diagnosis not present

## 2021-06-21 DIAGNOSIS — M62830 Muscle spasm of back: Secondary | ICD-10-CM | POA: Diagnosis not present

## 2021-06-21 DIAGNOSIS — S33140A Subluxation of L4/L5 lumbar vertebra, initial encounter: Secondary | ICD-10-CM | POA: Diagnosis not present

## 2021-07-12 DIAGNOSIS — R0989 Other specified symptoms and signs involving the circulatory and respiratory systems: Secondary | ICD-10-CM | POA: Diagnosis not present

## 2021-07-12 DIAGNOSIS — K219 Gastro-esophageal reflux disease without esophagitis: Secondary | ICD-10-CM | POA: Diagnosis not present

## 2021-07-19 DIAGNOSIS — S335XXD Sprain of ligaments of lumbar spine, subsequent encounter: Secondary | ICD-10-CM | POA: Diagnosis not present

## 2021-07-19 DIAGNOSIS — S33140A Subluxation of L4/L5 lumbar vertebra, initial encounter: Secondary | ICD-10-CM | POA: Diagnosis not present

## 2021-07-19 DIAGNOSIS — M62830 Muscle spasm of back: Secondary | ICD-10-CM | POA: Diagnosis not present

## 2021-07-19 DIAGNOSIS — S13110A Subluxation of C0/C1 cervical vertebrae, initial encounter: Secondary | ICD-10-CM | POA: Diagnosis not present

## 2021-08-16 DIAGNOSIS — S13110A Subluxation of C0/C1 cervical vertebrae, initial encounter: Secondary | ICD-10-CM | POA: Diagnosis not present

## 2021-08-16 DIAGNOSIS — S33140A Subluxation of L4/L5 lumbar vertebra, initial encounter: Secondary | ICD-10-CM | POA: Diagnosis not present

## 2021-08-16 DIAGNOSIS — M62830 Muscle spasm of back: Secondary | ICD-10-CM | POA: Diagnosis not present

## 2021-08-16 DIAGNOSIS — S335XXD Sprain of ligaments of lumbar spine, subsequent encounter: Secondary | ICD-10-CM | POA: Diagnosis not present

## 2021-09-04 ENCOUNTER — Telehealth: Payer: Self-pay

## 2021-09-04 ENCOUNTER — Ambulatory Visit
Admission: EM | Admit: 2021-09-04 | Discharge: 2021-09-04 | Disposition: A | Discharge status: Home / Self Care | Payer: BC Managed Care – PPO | Attending: Urgent Care | Admitting: Urgent Care

## 2021-09-04 DIAGNOSIS — H66002 Acute suppurative otitis media without spontaneous rupture of ear drum, left ear: Secondary | ICD-10-CM

## 2021-09-04 MED ORDER — AMOXICILLIN-POT CLAVULANATE 875-125 MG PO TABS: 1.0000 | ORAL_TABLET | Freq: Two times a day (BID) | ORAL | 0 refills | Status: DC

## 2021-09-04 MED ORDER — AMOXICILLIN-POT CLAVULANATE 875-125 MG PO TABS: 1.0000 | ORAL_TABLET | Freq: Two times a day (BID) | ORAL | 0 refills | Status: AC

## 2021-09-04 NOTE — Discharge Instructions (Addendum)
Your ear pain is due to otitis media, which is an infection behind the ear drum. Take the antibiotic twice daily with food to prevent an upset stomach. Yogurt and probiotics might also help. Use saline or flonase to help open your eustachian tube. Follow-up with PCP should symptoms persist or worsen

## 2021-09-04 NOTE — ED Triage Notes (Addendum)
Patient c.o left ear pain -- started yesterday Patient is unaware of any drainage.

## 2021-09-04 NOTE — ED Provider Notes (Signed)
[facility]    CSN: [id number] Arrival date & time: 09/04/21  1510      History   Chief Complaint Chief Complaint  Patient presents with   Otalgia    LEFT    HPI Cory Blake is a 51 y.o. male.   The history is provided by the patient and the spouse.  Otalgia Location:  Left Behind ear:  No abnormality Quality:  Sharp, aching and pressure Severity:  Severe Onset quality:  Sudden Duration:  2 days Timing:  Constant Progression:  Worsening Chronicity:  New Context: recent URI (recent flu and sinus issues)   Context: not direct blow, not elevation change, not foreign body in ear, not loud noise and not water in ear   Relieved by:  OTC medications Worsened by:  Nothing Ineffective treatments:  OTC medications Associated symptoms: congestion, fever and rhinorrhea   Associated symptoms: no abdominal pain, no cough, no diarrhea, no ear discharge, no headaches, no hearing loss, no neck pain, no rash, no sore throat, no tinnitus and no vomiting    Past Medical History:  Diagnosis Date   Allergic rhinitis    cat   Chronic neck pain    COVID-19    2 or 06/2019    Elevated liver enzymes    Vitamin D deficiency     Patient Active Problem List   Diagnosis Date Noted   Asthma 12/02/2019   Annual physical exam 09/01/2019   Chronic neck pain 09/01/2019   Chronic pain of right knee 09/01/2019   Prediabetes 10/17/2018   Vitamin D deficiency 10/17/2018   Hyperlipidemia 09/17/2018   Elevated liver enzymes    Allergic rhinitis    Status post Nissen fundoplication (without gastrostomy tube) procedure    Melena     Past Surgical History:  Procedure Laterality Date   ESOPHAGOGASTRODUODENOSCOPY (EGD) WITH PROPOFOL N/A 05/15/2018   Procedure: ESOPHAGOGASTRODUODENOSCOPY (EGD) WITH PROPOFOL;  Surgeon: [name], MD;  Location: [facility];  Service: Endoscopy;  Laterality: N/A;   GASTRIC FUNDOPLICATION  4818   for hiatal hernia   WISDOM TOOTH EXTRACTION          Home Medications    Prior to Admission medications   Medication Sig Start Date End Date Taking? Authorizing Provider  albuterol (VENTOLIN HFA) 108 (90 Base) MCG/ACT inhaler Inhale 1-2 puffs into the lungs every 6 (six) hours as needed for wheezing or shortness of breath. 02/26/19  Yes McLean-Scocuzza, Nino Glow, MD  amoxicillin-clavulanate (AUGMENTIN) 875-125 MG tablet Take 1 tablet by mouth every 12 (twelve) hours for 10 days. 09/04/21 09/14/21 Yes Britny Riel L, PA  Ascorbic Acid (VITAMIN C PO) Take by mouth.   Yes [provider]  Cholecalciferol (VITAMIN D-3) 125 MCG (5000 UT) TABS Take 5,000 Units by mouth daily.   Yes [provider]  clobetasol cream (TEMOVATE) 5.63 % Apply 1 application topically 2 (two) times daily. Prn hands 03/09/20  Yes McLean-Scocuzza, Nino Glow, MD  Probiotic Product (PROBIOTIC PO) Take by mouth.   Yes [provider]  zinc gluconate 50 MG tablet Take 50 mg by mouth daily.   Yes [provider]    Family History Family History  Problem Relation Age of Onset   Kidney failure Father        on HD   Heart disease Father        had valve surgery   Heart disease Sister        leaky valve s/p surgery congenital heart issues  Dementia Maternal Grandmother    Diabetes Paternal Grandmother    Kidney disease Paternal Grandmother     Social History Social History   Tobacco Use   Smoking status: Never   Smokeless tobacco: Never  Vaping Use   Vaping Use: Never used  Substance Use Topics   Alcohol use: Yes    Comment: minimal   Drug use: Never     Allergies   Peanut-containing drug products and Sulfa antibiotics   Review of Systems Review of Systems  Constitutional:  Positive for fever.  HENT:  Positive for congestion, ear pain and rhinorrhea. Negative for ear discharge, hearing loss, sore throat and tinnitus.   Respiratory:  Negative for cough.   Gastrointestinal:  Negative for abdominal pain, diarrhea and  vomiting.  Musculoskeletal:  Negative for neck pain.  Skin:  Negative for rash.  Neurological:  Negative for headaches.    Physical Exam Triage Vital Signs ED Triage Vitals  Enc Vitals Group     BP 09/04/21 1531 (!) 149/102     Pulse Rate 09/04/21 1531 92     Resp 09/04/21 1531 20     Temp 09/04/21 1531 98.7 F (37.1 C)     Temp Source 09/04/21 1531 Oral     SpO2 09/04/21 1531 96 %     Weight 09/04/21 1531 200 lb (90.7 kg)     Height 09/04/21 1531 6' (1.829 m)     Head Circumference --      Peak Flow --      Pain Score 09/04/21 1530 9     Pain Loc --      Pain Edu? --      Excl. in [place]? --    No data found.  Updated Vital Signs BP (!) 149/102 (BP Location: Left Arm)   Pulse 92   Temp 98.7 F (37.1 C) (Oral)   Resp 20   Ht 6' (1.829 m)   Wt 200 lb (90.7 kg)   SpO2 96%   BMI 27.12 kg/m   Visual Acuity Right Eye Distance:   Left Eye Distance:   Bilateral Distance:    Right Eye Near:   Left Eye Near:    Bilateral Near:     Physical Exam Vitals and nursing note reviewed. Exam conducted with a chaperone present.  Constitutional:      General: He is not in acute distress.    Appearance: Normal appearance. He is normal weight. He is not ill-appearing, toxic-appearing or diaphoretic.  HENT:     Head: Normocephalic and atraumatic.     Salivary Glands: Right salivary gland is not diffusely enlarged or tender. Left salivary gland is not diffusely enlarged or tender.     Right Ear: Tympanic membrane, ear canal and external ear normal. There is no impacted cerumen.     Left Ear: Ear canal and external ear normal. No swelling or tenderness.  No middle ear effusion. Tympanic membrane is injected, erythematous and bulging. Tympanic membrane is not perforated. Tympanic membrane has decreased mobility.     Nose: No congestion or rhinorrhea.     Right Turbinates: Not enlarged or swollen.     Left Turbinates: Not enlarged or swollen.     Right Sinus: No maxillary sinus  tenderness or frontal sinus tenderness.     Left Sinus: No maxillary sinus tenderness or frontal sinus tenderness.     Mouth/Throat:     Mouth: Mucous membranes are moist. No oral lesions.     Pharynx: Oropharynx is clear.  No pharyngeal swelling, oropharyngeal exudate, posterior oropharyngeal erythema or uvula swelling.  Lymphadenopathy:     Cervical: No cervical adenopathy.  Neurological:     Mental Status: He is alert.     UC Treatments / Results  Labs (all labs ordered are listed, but only abnormal results are displayed) Labs Reviewed - No data to display  EKG   Radiology No results found.  Procedures Procedures (including critical care time)  Medications Ordered in UC Medications - No data to display  Initial Impression / Assessment and Plan / UC Course  I have reviewed the triage vital signs and the nursing notes.  Pertinent labs & imaging results that were available during my care of the patient were reviewed by me and considered in my medical decision making (see chart for details).     L side OM - start augmentin due to concerns for possible concurrent developing sinus infection. Flonase/ saline to open ET. RTC precautions discussed  Final Clinical Impressions(s) / UC Diagnoses   Final diagnoses:  Non-recurrent acute suppurative otitis media of left ear without spontaneous rupture of tympanic membrane     Discharge Instructions      Your ear pain is due to otitis media, which is an infection behind the ear drum. Take the antibiotic twice daily with food to prevent an upset stomach. Yogurt and probiotics might also help. Use saline or flonase to help open your eustachian tube. Follow-up with PCP should symptoms persist or worsen      ED Prescriptions     Medication Sig Dispense Auth. Provider   amoxicillin-clavulanate (AUGMENTIN) 875-125 MG tablet Take 1 tablet by mouth every 12 (twelve) hours for 10 days. 20 tablet Shima Compere L, [place]      PDMP  not reviewed this encounter.   Chaney Malling, [place] 09/04/21 1601

## 2021-09-13 DIAGNOSIS — S33140A Subluxation of L4/L5 lumbar vertebra, initial encounter: Secondary | ICD-10-CM | POA: Diagnosis not present

## 2021-09-13 DIAGNOSIS — S13110A Subluxation of C0/C1 cervical vertebrae, initial encounter: Secondary | ICD-10-CM | POA: Diagnosis not present

## 2021-09-13 DIAGNOSIS — S335XXD Sprain of ligaments of lumbar spine, subsequent encounter: Secondary | ICD-10-CM | POA: Diagnosis not present

## 2021-09-13 DIAGNOSIS — M62830 Muscle spasm of back: Secondary | ICD-10-CM | POA: Diagnosis not present

## 2021-09-18 DIAGNOSIS — K219 Gastro-esophageal reflux disease without esophagitis: Secondary | ICD-10-CM | POA: Diagnosis not present

## 2021-09-18 DIAGNOSIS — H7202 Central perforation of tympanic membrane, left ear: Secondary | ICD-10-CM | POA: Diagnosis not present

## 2021-09-18 DIAGNOSIS — H6500 Acute serous otitis media, unspecified ear: Secondary | ICD-10-CM | POA: Diagnosis not present

## 2021-10-03 DIAGNOSIS — H908 Mixed conductive and sensorineural hearing loss, unspecified: Secondary | ICD-10-CM | POA: Diagnosis not present

## 2021-10-03 DIAGNOSIS — H9072 Mixed conductive and sensorineural hearing loss, unilateral, left ear, with unrestricted hearing on the contralateral side: Secondary | ICD-10-CM | POA: Diagnosis not present

## 2021-10-03 DIAGNOSIS — H7202 Central perforation of tympanic membrane, left ear: Secondary | ICD-10-CM | POA: Diagnosis not present

## 2021-10-11 DIAGNOSIS — S33140A Subluxation of L4/L5 lumbar vertebra, initial encounter: Secondary | ICD-10-CM | POA: Diagnosis not present

## 2021-10-11 DIAGNOSIS — S335XXD Sprain of ligaments of lumbar spine, subsequent encounter: Secondary | ICD-10-CM | POA: Diagnosis not present

## 2021-10-11 DIAGNOSIS — M62830 Muscle spasm of back: Secondary | ICD-10-CM | POA: Diagnosis not present

## 2021-10-11 DIAGNOSIS — S13110A Subluxation of C0/C1 cervical vertebrae, initial encounter: Secondary | ICD-10-CM | POA: Diagnosis not present

## 2021-11-08 DIAGNOSIS — S13110A Subluxation of C0/C1 cervical vertebrae, initial encounter: Secondary | ICD-10-CM | POA: Diagnosis not present

## 2021-11-08 DIAGNOSIS — M62830 Muscle spasm of back: Secondary | ICD-10-CM | POA: Diagnosis not present

## 2021-11-08 DIAGNOSIS — S335XXD Sprain of ligaments of lumbar spine, subsequent encounter: Secondary | ICD-10-CM | POA: Diagnosis not present

## 2021-11-08 DIAGNOSIS — S33140A Subluxation of L4/L5 lumbar vertebra, initial encounter: Secondary | ICD-10-CM | POA: Diagnosis not present

## 2021-12-13 DIAGNOSIS — M62830 Muscle spasm of back: Secondary | ICD-10-CM | POA: Diagnosis not present

## 2021-12-13 DIAGNOSIS — S13110A Subluxation of C0/C1 cervical vertebrae, initial encounter: Secondary | ICD-10-CM | POA: Diagnosis not present

## 2021-12-13 DIAGNOSIS — S33140A Subluxation of L4/L5 lumbar vertebra, initial encounter: Secondary | ICD-10-CM | POA: Diagnosis not present

## 2021-12-13 DIAGNOSIS — S335XXD Sprain of ligaments of lumbar spine, subsequent encounter: Secondary | ICD-10-CM | POA: Diagnosis not present

## 2022-01-10 DIAGNOSIS — S13110A Subluxation of C0/C1 cervical vertebrae, initial encounter: Secondary | ICD-10-CM | POA: Diagnosis not present

## 2022-01-10 DIAGNOSIS — M62830 Muscle spasm of back: Secondary | ICD-10-CM | POA: Diagnosis not present

## 2022-01-10 DIAGNOSIS — S335XXD Sprain of ligaments of lumbar spine, subsequent encounter: Secondary | ICD-10-CM | POA: Diagnosis not present

## 2022-01-10 DIAGNOSIS — S33140A Subluxation of L4/L5 lumbar vertebra, initial encounter: Secondary | ICD-10-CM | POA: Diagnosis not present

## 2022-02-07 DIAGNOSIS — S335XXD Sprain of ligaments of lumbar spine, subsequent encounter: Secondary | ICD-10-CM | POA: Diagnosis not present

## 2022-02-07 DIAGNOSIS — S33140A Subluxation of L4/L5 lumbar vertebra, initial encounter: Secondary | ICD-10-CM | POA: Diagnosis not present

## 2022-02-07 DIAGNOSIS — S13110A Subluxation of C0/C1 cervical vertebrae, initial encounter: Secondary | ICD-10-CM | POA: Diagnosis not present

## 2022-02-07 DIAGNOSIS — M62830 Muscle spasm of back: Secondary | ICD-10-CM | POA: Diagnosis not present
